# Patient Record
Sex: Female | Born: 1950
Health system: Southern US, Community
[De-identification: ages and names within clinical notes are randomized; demographics above are authoritative.]

## PROBLEM LIST (undated history)

## (undated) DIAGNOSIS — F329 Major depressive disorder, single episode, unspecified: Secondary | ICD-10-CM

## (undated) DIAGNOSIS — R945 Abnormal results of liver function studies: Secondary | ICD-10-CM

## (undated) DIAGNOSIS — M797 Fibromyalgia: Secondary | ICD-10-CM

## (undated) DIAGNOSIS — M1990 Unspecified osteoarthritis, unspecified site: Secondary | ICD-10-CM

## (undated) DIAGNOSIS — K5790 Diverticulosis of intestine, part unspecified, without perforation or abscess without bleeding: Secondary | ICD-10-CM

## (undated) DIAGNOSIS — R7989 Other specified abnormal findings of blood chemistry: Secondary | ICD-10-CM

## (undated) DIAGNOSIS — J189 Pneumonia, unspecified organism: Secondary | ICD-10-CM

## (undated) DIAGNOSIS — E7800 Pure hypercholesterolemia, unspecified: Secondary | ICD-10-CM

## (undated) DIAGNOSIS — F32A Depression, unspecified: Secondary | ICD-10-CM

## (undated) DIAGNOSIS — F419 Anxiety disorder, unspecified: Secondary | ICD-10-CM

## (undated) DIAGNOSIS — G9332 Myalgic encephalomyelitis/chronic fatigue syndrome: Secondary | ICD-10-CM

## (undated) DIAGNOSIS — K5792 Diverticulitis of intestine, part unspecified, without perforation or abscess without bleeding: Secondary | ICD-10-CM

## (undated) DIAGNOSIS — R5382 Chronic fatigue, unspecified: Secondary | ICD-10-CM

## (undated) HISTORY — DX: Other specified abnormal findings of blood chemistry: R79.89

## (undated) HISTORY — DX: Fibromyalgia: M79.7

## (undated) HISTORY — DX: Diverticulosis of intestine, part unspecified, without perforation or abscess without bleeding: K57.90

## (undated) HISTORY — PX: HERNIA REPAIR: SHX51

## (undated) HISTORY — PX: VAGINAL HYSTERECTOMY: SUR661

## (undated) HISTORY — DX: Chronic fatigue, unspecified: R53.82

## (undated) HISTORY — DX: Abnormal results of liver function studies: R94.5

## (undated) HISTORY — DX: Pure hypercholesterolemia, unspecified: E78.00

## (undated) HISTORY — DX: Myalgic encephalomyelitis/chronic fatigue syndrome: G93.32

## (undated) HISTORY — DX: Diverticulitis of intestine, part unspecified, without perforation or abscess without bleeding: K57.92

---
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
## 2001-12-23 ENCOUNTER — Other Ambulatory Visit: Admission: RE | Admit: 2001-12-23 | Discharge: 2001-12-23 | Payer: Self-pay | Admitting: Family Medicine

## 2003-07-14 ENCOUNTER — Encounter: Admission: RE | Admit: 2003-07-14 | Discharge: 2003-07-14 | Payer: Self-pay | Admitting: Family Medicine

## 2005-04-16 ENCOUNTER — Other Ambulatory Visit: Admission: RE | Admit: 2005-04-16 | Discharge: 2005-04-16 | Payer: Self-pay | Admitting: Family Medicine

## 2006-05-08 ENCOUNTER — Encounter: Admission: RE | Admit: 2006-05-08 | Discharge: 2006-05-08 | Payer: Self-pay | Admitting: Family Medicine

## 2009-05-05 ENCOUNTER — Encounter: Admission: RE | Admit: 2009-05-05 | Discharge: 2009-05-05 | Payer: Self-pay | Admitting: Family Medicine

## 2010-01-02 ENCOUNTER — Ambulatory Visit (HOSPITAL_COMMUNITY): Admission: RE | Admit: 2010-01-02 | Discharge: 2010-01-02 | Payer: Self-pay | Admitting: Family Medicine

## 2010-01-08 ENCOUNTER — Encounter: Admission: RE | Admit: 2010-01-08 | Discharge: 2010-01-08 | Payer: Self-pay | Admitting: Family Medicine

## 2010-08-07 ENCOUNTER — Encounter: Payer: Self-pay | Admitting: Gastroenterology

## 2010-08-07 ENCOUNTER — Ambulatory Visit (INDEPENDENT_AMBULATORY_CARE_PROVIDER_SITE_OTHER): Payer: BC Managed Care – PPO | Admitting: Gastroenterology

## 2010-08-07 ENCOUNTER — Other Ambulatory Visit (INDEPENDENT_AMBULATORY_CARE_PROVIDER_SITE_OTHER): Payer: BC Managed Care – PPO

## 2010-08-07 VITALS — BP 136/60 | Ht 61.0 in | Wt 160.0 lb

## 2010-08-07 DIAGNOSIS — R1013 Epigastric pain: Secondary | ICD-10-CM

## 2010-08-07 LAB — CBC WITH DIFFERENTIAL/PLATELET
Basophils Absolute: 0.1 10*3/uL (ref 0.0–0.1)
Basophils Relative: 0.8 % (ref 0.0–3.0)
Eosinophils Absolute: 0.2 10*3/uL (ref 0.0–0.7)
Eosinophils Relative: 3 % (ref 0.0–5.0)
HCT: 40.4 % (ref 36.0–46.0)
Hemoglobin: 14.2 g/dL (ref 12.0–15.0)
Lymphocytes Relative: 35.4 % (ref 12.0–46.0)
Lymphs Abs: 2.9 10*3/uL (ref 0.7–4.0)
MCHC: 35.2 g/dL (ref 30.0–36.0)
MCV: 95.7 fl (ref 78.0–100.0)
Monocytes Absolute: 0.7 10*3/uL (ref 0.1–1.0)
Monocytes Relative: 8.6 % (ref 3.0–12.0)
Neutro Abs: 4.3 10*3/uL (ref 1.4–7.7)
Neutrophils Relative %: 52.2 % (ref 43.0–77.0)
Platelets: 243 10*3/uL (ref 150.0–400.0)
RBC: 4.22 Mil/uL (ref 3.87–5.11)
RDW: 13.2 % (ref 11.5–14.6)
WBC: 8.2 10*3/uL (ref 4.5–10.5)

## 2010-08-07 LAB — COMPREHENSIVE METABOLIC PANEL WITH GFR
ALT: 23 U/L (ref 0–35)
AST: 23 U/L (ref 0–37)
Albumin: 3.5 g/dL (ref 3.5–5.2)
Alkaline Phosphatase: 71 U/L (ref 39–117)
BUN: 15 mg/dL (ref 6–23)
CO2: 29 meq/L (ref 19–32)
Calcium: 8.8 mg/dL (ref 8.4–10.5)
Chloride: 106 meq/L (ref 96–112)
Creatinine, Ser: 1 mg/dL (ref 0.4–1.2)
GFR: 63.7 mL/min
Glucose, Bld: 78 mg/dL (ref 70–99)
Potassium: 4.4 meq/L (ref 3.5–5.1)
Sodium: 141 meq/L (ref 135–145)
Total Bilirubin: 0.8 mg/dL (ref 0.3–1.2)
Total Protein: 6.3 g/dL (ref 6.0–8.3)

## 2010-08-07 NOTE — Patient Instructions (Addendum)
You will be set up for an ultrasound today or early tomorrow to check for gallstones.  [facility] 715 am 08/08/10 Nothing to eat or drink after midnight. You will have labs checked today in the basement lab.  Please head down after you check out with the front desk  (cbc, cmet).

## 2010-08-07 NOTE — Progress Notes (Signed)
   HPI: This is a  very pleasant 60 year old woman  Had terrible epigastric to RUQ pains, acute.  Cuased nausea.  Had mild fever.  Colic pains.  Pains lasted 6-7 hours.   never had problems before either.  Pain radiated to right shoulder.   Had repeat pains lasting about 3 hours last night.   She has been eating healthier, has lost 10-12 pounds lately.  Doesn't eat fried foods. No overt GI bleeding.  She takes almost no NSAIDs. She does not drink alcohol.  Review of systems: Pertinent positive and negative review of systems were noted in the above HPI section.  All other review of systems was otherwise negative.   Past Medical History, Past Surgical History, Family History, Social History, Current Medications, Allergies were all reviewed with the patient via [facility] electronic medical record system.   Physical Exam: BP 136/60  Ht 5\' 1"  (1.549 m)  Wt 160 lb (72.576 kg)  BMI 30.23 kg/m2 Constitutional: generally well-appearing Psychiatric: alert and oriented x3 Eyes: extraocular movements intact Mouth: oral pharynx moist, no lesions Neck: supple no lymphadenopathy Cardiovascular: heart regular rate and rhythm Lungs: clear to auscultation bilaterally Abdomen: soft, nontender, nondistended, no obvious ascites, no peritoneal signs, normal bowel sounds Extremities: no lower extremity edema bilaterally Skin: no lesions on visible extremities    Assessment and plan: 60 y.o. female with very typical biliary colic symptoms  We will set her up with an abdominal ultrasound today or tomorrow morning as well as a CBC and complete metabolic profile. I do think she is having biliary colic. If the ultrasound is normal my next step would be a HIDA scan to check her for biliary dyskinesia. She has had 2 pretty severe attacks in the past 4 or 5 days and I think it ultrasound is positive for gallstones then I would work on an expedited general surgical evaluation.

## 2010-08-08 ENCOUNTER — Ambulatory Visit (HOSPITAL_COMMUNITY)
Admission: RE | Admit: 2010-08-08 | Discharge: 2010-08-08 | Disposition: A | Discharge status: Home / Self Care | Payer: BC Managed Care – PPO | Source: Ambulatory Visit | Attending: Gastroenterology | Admitting: Gastroenterology

## 2010-08-08 ENCOUNTER — Telehealth: Payer: Self-pay | Admitting: Gastroenterology

## 2010-08-08 DIAGNOSIS — R1110 Vomiting, unspecified: Secondary | ICD-10-CM | POA: Insufficient documentation

## 2010-08-08 DIAGNOSIS — R1013 Epigastric pain: Secondary | ICD-10-CM | POA: Insufficient documentation

## 2010-08-08 DIAGNOSIS — R509 Fever, unspecified: Secondary | ICD-10-CM | POA: Insufficient documentation

## 2010-08-08 DIAGNOSIS — K838 Other specified diseases of biliary tract: Secondary | ICD-10-CM | POA: Insufficient documentation

## 2010-08-08 NOTE — Telephone Encounter (Signed)
Pt needs HIDA [facility]   08/29/10 10 am 945 am arrival nothing to eat or drink after midnight. Pt also aware of the labs.

## 2010-08-29 ENCOUNTER — Inpatient Hospital Stay (HOSPITAL_COMMUNITY): Admission: RE | Admit: 2010-08-29 | Payer: BC Managed Care – PPO | Source: Ambulatory Visit

## 2011-01-10 ENCOUNTER — Other Ambulatory Visit: Payer: Self-pay | Admitting: Nurse Practitioner

## 2011-01-10 DIAGNOSIS — R748 Abnormal levels of other serum enzymes: Secondary | ICD-10-CM

## 2011-01-14 ENCOUNTER — Ambulatory Visit
Admission: RE | Admit: 2011-01-14 | Discharge: 2011-01-14 | Disposition: A | Discharge status: Home / Self Care | Payer: BC Managed Care – PPO | Source: Ambulatory Visit | Attending: Nurse Practitioner | Admitting: Nurse Practitioner

## 2011-01-14 DIAGNOSIS — R748 Abnormal levels of other serum enzymes: Secondary | ICD-10-CM

## 2011-05-10 ENCOUNTER — Ambulatory Visit (INDEPENDENT_AMBULATORY_CARE_PROVIDER_SITE_OTHER): Payer: BC Managed Care – PPO | Admitting: Family Medicine

## 2011-05-10 ENCOUNTER — Ambulatory Visit: Payer: BC Managed Care – PPO

## 2011-05-10 VITALS — BP 152/100 | HR 85 | Temp 98.2°F | Resp 16 | Ht 62.0 in | Wt 160.0 lb

## 2011-05-10 DIAGNOSIS — R071 Chest pain on breathing: Secondary | ICD-10-CM

## 2011-05-10 DIAGNOSIS — R0781 Pleurodynia: Secondary | ICD-10-CM

## 2011-05-10 LAB — POCT CBC
Granulocyte percent: 50.8 % (ref 37–80)
HCT, POC: 46.8 % (ref 37.7–47.9)
Hemoglobin: 15 g/dL (ref 12.2–16.2)
Lymph, poc: 3.8 — AB (ref 0.6–3.4)
MCH, POC: 30.7 pg (ref 27–31.2)
MCHC: 32.1 g/dL (ref 31.8–35.4)
MCV: 95.8 fL (ref 80–97)
MID (cbc): 0.5 (ref 0–0.9)
MPV: 8.9 fL (ref 0–99.8)
POC Granulocyte: 4.4 (ref 2–6.9)
POC LYMPH PERCENT: 43.9 % (ref 10–50)
POC MID %: 5.3 % (ref 0–12)
Platelet Count, POC: 273 10*3/uL (ref 142–424)
RBC: 4.88 M/uL (ref 4.04–5.48)
RDW, POC: 13.2 %
WBC: 8.6 10*3/uL (ref 4.6–10.2)

## 2011-05-10 LAB — POCT SEDIMENTATION RATE: POCT SED RATE: 25 mm/h — AB (ref 0–22)

## 2011-05-10 MED ORDER — TRAMADOL HCL 50 MG PO TABS: 50.0000 mg | ORAL_TABLET | Freq: Three times a day (TID) | ORAL | Status: DC | PRN

## 2011-05-10 MED ORDER — OXAPROZIN 600 MG PO TABS: ORAL_TABLET | ORAL | Status: DC

## 2011-05-10 NOTE — Progress Notes (Signed)
Filed Vitals:   05/10/11 1350  BP: 152/100  Pulse: 85  Temp: 98.2 F (36.8 C)  Resp: 16   Subjective:: Patient is here complaining of a pain in her right upper back since Tuesday. It was just narrowed Tuesday which come up. She does not know of any cause of it. Did not do any activities out of the ordinary on Monday as a gripping pain, and just hurts which takes a deep breath. She has been trying various things without relief. She got a massage and that did not help. The person doing the massage did not find a real tender trigger point. No other respiratory symptoms at this time.  HEENT unremarkable. GI unremarkable. GU unremarkable. Cardiovascular unremarkable, although some of her friends said to be cautious, that women can get atypical pains from their heart attacks.  Objective: Alert oriented white female in no acute distress at this time. Her blood pressure was a little high when she came in, but on repeat I got 144/88. She had taken a couple times at home today and got good readings. Neck is supple. Chest clear to auscultation. Chest wall does not have any point tenderness on it. Good range of motion of her upper back. Heart was regular without any murmurs gallops arrhythmias. Abdomen soft, nontender. Straight leg raise test negative. Simply has a pleural like pain when she takes a deep breath. [facility] reading (PRIMARY) by  Dr. Alwyn Ren Chest x-ray is normal.  Results for orders placed in visit on 05/10/11  POCT CBC      Component Value Range   WBC 8.6  4.6 - 10.2 (K/uL)   Lymph, poc 3.8 (*) 0.6 - 3.4    POC LYMPH PERCENT 43.9  10 - 50 (%L)   MID (cbc) 0.5  0 - 0.9    POC MID % 5.3  0 - 12 (%M)   POC Granulocyte 4.4  2 - 6.9    Granulocyte percent 50.8  37 - 80 (%G)   RBC 4.88  4.04 - 5.48 (M/uL)   Hemoglobin 15.0  12.2 - 16.2 (g/dL)   HCT, POC 16.1  09.6 - 47.9 (%)   MCV 95.8  80 - 97 (fL)   MCH, POC 30.7  27 - 31.2 (pg)   MCHC 32.1  31.8 - 35.4 (g/dL)   RDW, POC 04.5     Platelet  Count, POC 273  142 - 424 (K/uL)   MPV 8.9  0 - 99.8 (fL)      Assessment: Pleuritic right upper back pain Nonhypertensive blood pressure elevation  Plan: Check a chest x-ray on her, as well as a CBC and sedimentation rate and will proceed from there thank

## 2011-05-10 NOTE — Patient Instructions (Signed)
Pleurisy Pleurisy is an inflammation and swelling of the lining of the lungs. It usually is the result of an underlying infection or other disease. Because of this inflammation, it hurts to breathe. It is aggravated by coughing or deep breathing. The primary goal in treating pleurisy is to diagnose and treat the condition that caused it.  HOME CARE INSTRUCTIONS   Only take over-the-counter or prescription medicines for pain, discomfort, or fever as directed by your caregiver.   If medications which kill germs (antibiotics) were prescribed, take the entire course. Even if you are feeling better, you need to take them.   Use a cool mist vaporizer to help loosen secretions. This is so the secretions can be coughed up more easily.  SEEK MEDICAL CARE IF:   Your pain is not controlled with medication or is increasing.   You have an increase inpus like (purulent) secretions brought up with coughing.  SEEK IMMEDIATE MEDICAL CARE IF:   You have blue or dark lips, fingernails, or toenails.   You begin coughing up blood.   You have increased difficulty breathing.   You have continuing pain unrelieved by medicine or lasting more than 1 week.   You have pain that radiates into your neck, arms, or jaw.   You develop increased shortness of breath or wheezing.   You develop a fever, rash, vomiting, fainting, or other serious complaints.  Document Released: 02/25/2005 Document Revised: 11/07/2010 Document Reviewed: 09/26/2006 Exit[employer] Patient Information 2012 [employer]. 

## 2011-05-13 ENCOUNTER — Encounter (HOSPITAL_COMMUNITY): Payer: Self-pay

## 2011-05-13 ENCOUNTER — Emergency Department (HOSPITAL_COMMUNITY): Payer: BC Managed Care – PPO

## 2011-05-13 ENCOUNTER — Emergency Department (HOSPITAL_COMMUNITY)
Admission: EM | Admit: 2011-05-13 | Discharge: 2011-05-13 | Disposition: A | Discharge status: Home / Self Care | Payer: BC Managed Care – PPO | Attending: Emergency Medicine | Admitting: Emergency Medicine

## 2011-05-13 ENCOUNTER — Other Ambulatory Visit: Payer: Self-pay

## 2011-05-13 DIAGNOSIS — M542 Cervicalgia: Secondary | ICD-10-CM | POA: Insufficient documentation

## 2011-05-13 DIAGNOSIS — M79609 Pain in unspecified limb: Secondary | ICD-10-CM

## 2011-05-13 DIAGNOSIS — R079 Chest pain, unspecified: Secondary | ICD-10-CM | POA: Insufficient documentation

## 2011-05-13 DIAGNOSIS — F411 Generalized anxiety disorder: Secondary | ICD-10-CM | POA: Insufficient documentation

## 2011-05-13 LAB — COMPREHENSIVE METABOLIC PANEL WITH GFR
ALT: 22 U/L (ref 0–35)
AST: 22 U/L (ref 0–37)
Albumin: 3.8 g/dL (ref 3.5–5.2)
Alkaline Phosphatase: 92 U/L (ref 39–117)
BUN: 12 mg/dL (ref 6–23)
CO2: 31 meq/L (ref 19–32)
Calcium: 10 mg/dL (ref 8.4–10.5)
Chloride: 103 meq/L (ref 96–112)
Creatinine, Ser: 0.79 mg/dL (ref 0.50–1.10)
GFR calc Af Amer: >90 mL/min
GFR calc non Af Amer: 88 mL/min — ABNORMAL LOW
Glucose, Bld: 120 mg/dL — ABNORMAL HIGH (ref 70–99)
Potassium: 4.8 meq/L (ref 3.5–5.1)
Sodium: 143 meq/L (ref 135–145)
Total Bilirubin: 0.6 mg/dL (ref 0.3–1.2)
Total Protein: 7.2 g/dL (ref 6.0–8.3)

## 2011-05-13 LAB — CBC
HCT: 42.5 % (ref 36.0–46.0)
Hemoglobin: 15.1 g/dL — ABNORMAL HIGH (ref 12.0–15.0)
MCH: 32.9 pg (ref 26.0–34.0)
MCHC: 35.5 g/dL (ref 30.0–36.0)
MCV: 92.6 fL (ref 78.0–100.0)
Platelets: 234 10*3/uL (ref 150–400)
RBC: 4.59 MIL/uL (ref 3.87–5.11)
RDW: 12.7 % (ref 11.5–15.5)
WBC: 9.6 10*3/uL (ref 4.0–10.5)

## 2011-05-13 LAB — D-DIMER, QUANTITATIVE: D-Dimer, Quant: 1.11 ug{FEU}/mL — ABNORMAL HIGH (ref 0.00–0.48)

## 2011-05-13 LAB — TROPONIN I
Troponin I: <0.3 ng/mL
Troponin I: <0.3 ng/mL

## 2011-05-13 MED ORDER — GI COCKTAIL ~~LOC~~
30.0000 mL | Freq: Once | ORAL | Status: AC
  Administered 2011-05-13: 30 mL via ORAL
  Filled 2011-05-13: qty 30

## 2011-05-13 MED ORDER — OMEPRAZOLE 20 MG PO CPDR: 20.0000 mg | DELAYED_RELEASE_CAPSULE | Freq: Every day | ORAL | Status: DC

## 2011-05-13 MED ORDER — NITROGLYCERIN 0.4 MG SL SUBL: 0.4000 mg | SUBLINGUAL_TABLET | SUBLINGUAL | Status: DC | PRN

## 2011-05-13 MED ORDER — IOHEXOL 300 MG/ML  SOLN
100.0000 mL | Freq: Once | INTRAMUSCULAR | Status: AC | PRN
  Administered 2011-05-13: 60 mL via INTRAVENOUS

## 2011-05-13 NOTE — ED Notes (Signed)
Patient reports that she has had persistent chest tightness with pain to left arm and neck, evaluated at [facility] a few days ago and negative exam. Patient reports that her pain has persisted

## 2011-05-13 NOTE — Progress Notes (Signed)
VASCULAR LAB PRELIMINARY  PRELIMINARY  PRELIMINARY  PRELIMINARY  Left lower extremity venous duplex completed.    Preliminary report:  Left:  No evidence of DVT, superficial thrombosis, or Baker's cyst.  Terance Hart, RVT 05/13/2011, 7:49 PM

## 2011-05-13 NOTE — Discharge Instructions (Signed)
Chest Pain (Nonspecific) It is often hard to give a specific diagnosis for the cause of chest pain. There is always a chance that your pain could be related to something serious, such as a heart attack or a blood clot in the lungs. You need to follow up with your caregiver for further evaluation. CAUSES   Heartburn.   Pneumonia or bronchitis.   Anxiety or stress.   Inflammation around your heart (pericarditis) or lung (pleuritis or pleurisy).   A blood clot in the lung.   A collapsed lung (pneumothorax). It can develop suddenly on its own (spontaneous pneumothorax) or from injury (trauma) to the chest.   Shingles infection (herpes zoster virus).  The chest wall is composed of bones, muscles, and cartilage. Any of these can be the source of the pain.  The bones can be bruised by injury.   The muscles or cartilage can be strained by coughing or overwork.   The cartilage can be affected by inflammation and become sore (costochondritis).  DIAGNOSIS  Lab tests or other studies, such as X-rays, electrocardiography, stress testing, or cardiac imaging, may be needed to find the cause of your pain.  TREATMENT   Treatment depends on what may be causing your chest pain. Treatment may include:   Acid blockers for heartburn.   Anti-inflammatory medicine.   Pain medicine for inflammatory conditions.   Antibiotics if an infection is present.   You may be advised to change lifestyle habits. This includes stopping smoking and avoiding alcohol, caffeine, and chocolate.   You may be advised to keep your head raised (elevated) when sleeping. This reduces the chance of acid going backward from your stomach into your esophagus.   Most of the time, nonspecific chest pain will improve within 2 to 3 days with rest and mild pain medicine.  HOME CARE INSTRUCTIONS   If antibiotics were prescribed, take your antibiotics as directed. Finish them even if you start to feel better.   For the next few  days, avoid physical activities that bring on chest pain. Continue physical activities as directed.   Do not smoke.   Avoid drinking alcohol.   Only take over-the-counter or prescription medicine for pain, discomfort, or fever as directed by your caregiver.   Follow your caregiver's suggestions for further testing if your chest pain does not go away.   Keep any follow-up appointments you made. If you do not go to an appointment, you could develop lasting (chronic) problems with pain. If there is any problem keeping an appointment, you must call to reschedule.  SEEK MEDICAL CARE IF:   You think you are having problems from the medicine you are taking. Read your medicine instructions carefully.   Your chest pain does not go away, even after treatment.   You develop a rash with blisters on your chest.  SEEK IMMEDIATE MEDICAL CARE IF:   You have increased chest pain or pain that spreads to your arm, neck, jaw, back, or abdomen.   You develop shortness of breath, an increasing cough, or you are coughing up blood.   You have severe back or abdominal pain, feel nauseous, or vomit.   You develop severe weakness, fainting, or chills.   You have a fever.  THIS IS AN EMERGENCY. Do not wait to see if the pain will go away. Get medical help at once. Call your local emergency services (911 in [place] Do not drive yourself to the hospital. MAKE SURE YOU:   Understand these instructions.     Will watch your condition.   Will get help right away if you are not doing well or get worse.  Document Released: 12/05/2004 Document Revised: 02/14/2011 Document Reviewed: 10/01/2007 Exit[employer] Patient Information 2012 [employer]. 

## 2011-05-14 NOTE — ED Provider Notes (Signed)
History     CSN: [id number]  Arrival date & time 05/13/11  1618   First MD Initiated Contact with Patient 05/13/11 1649      Chief Complaint  Patient presents with  . Chest Pain    Patient is a 61 y.o. female presenting with chest pain. The history is provided by the patient.  Chest Pain    patient reports approximately 3 days of chest tightness as well as left neck and arm tightness.  Diagnosis been persistent however today her tightness is worsened and has been present for the majority of the day.  She has a history of fibromyalgia and hypercholesterolemia.  She does have a distant history of cardiac disease in the family.  She does not smoke cigarettes.  She's never had a stress test.  She does report recent travel tick history, long flight back.  Her husband currently is hospitalized at [facility] where he underwent emergent bypass surgery for three-vessel coronary disease.  This is occurred over the last 4 or 5 days.  The patient denies nausea vomiting or diarrhea.  She reports one episode of sweating.  She does report subjective dyspnea.  She reports some discomfort down her left thigh and hip which is new for associated with her current symptoms.  She has no prior history of DVT or pulmonary embolism.  She's had no recent surgery.  She is however have recent long travel.  She is very concerned this could be coming from her heart especially given her husband's presentation.  Past Medical History  Diagnosis Date  . Fibromyalgia   . Hypercholesterolemia     Past Surgical History  Procedure Date  . Abdominal hysterectomy     Family History  Problem Relation Age of Onset  . Lung cancer Mother     History  Substance Use Topics  . Smoking status: Never Smoker   . Smokeless tobacco: Not on file  . Alcohol Use: Yes     occ    OB History    Grav Para Term Preterm Abortions TAB SAB Ect Mult Living                  Review of Systems  Cardiovascular: Positive for chest  pain.  All other systems reviewed and are negative.    Allergies  Valtrex  Home Medications   Current Outpatient Rx  Name Route Sig Dispense Refill  . VITAMIN D 2000 UNITS PO TABS Oral Take 2,000 Units by mouth daily.    Marland Kitchen KRILL OIL 300 MG PO CAPS Oral Take 1 capsule by mouth daily.     Marland Kitchen MAGNESIUM OXIDE 400 MG PO TABS Oral Take 400 mg by mouth at bedtime.     . OXAPROZIN 600 MG PO TABS  Take 1 twice daily for pain and inflammation of pleurisy 20 tablet 0  . TRAMADOL HCL 50 MG PO TABS Oral Take 50 mg by mouth every 8 (eight) hours as needed.    Marland Kitchen OMEPRAZOLE 20 MG PO CPDR Oral Take 1 capsule (20 mg total) by mouth daily. 30 capsule 0    BP 137/70  Pulse 71  Temp(Src) 98.5 F (36.9 C) (Oral)  Resp 18  Wt 160 lb (72.576 kg)  SpO2 96%  Physical Exam  Nursing note and vitals reviewed. Constitutional: She is oriented to person, place, and time. She appears well-developed and well-nourished. No distress.  HENT:  Head: Normocephalic and atraumatic.  Eyes: EOM are normal.  Neck: Normal range of motion.  Cardiovascular: Normal rate, regular rhythm and normal heart sounds.   Pulmonary/Chest: Effort normal and breath sounds normal.  Abdominal: Soft. She exhibits no distension. There is no tenderness.  Musculoskeletal: Normal range of motion.  Neurological: She is alert and oriented to person, place, and time.  Skin: Skin is warm and dry.  Psychiatric: She has a normal mood and affect. Judgment normal.       Anxious appearing    ED Course  Procedures (including critical care time)   Date: 05/14/2011  Rate: 114  Rhythm: Sinus tachycardia  QRS Axis: normal  Intervals: normal  ST/T Wave abnormalities: normal  Conduction Disutrbances: none  Narrative Interpretation:   Old EKG Reviewed: No prior EKGs available     Labs Reviewed  CBC - Abnormal; Notable for the following:    Hemoglobin 15.1 (*)    All other components within normal limits  COMPREHENSIVE METABOLIC PANEL -  Abnormal; Notable for the following:    Glucose, Bld 120 (*)    GFR calc non Af Amer 88 (*)    All other components within normal limits  D-DIMER, QUANTITATIVE - Abnormal; Notable for the following:    D-Dimer, Quant 1.11 (*)    All other components within normal limits  TROPONIN I  TROPONIN I   Dg Chest 2 View  05/13/2011  *RADIOLOGY REPORT*  Clinical Data: Chest pain and tightness.  CHEST - 2 VIEW  Comparison: 05/10/2011  Findings: Bibasilar scarring noted, stable from 2011.  Cardiac and mediastinal contours appear unremarkable.  The lungs appear otherwise clear.  IMPRESSION:  1.  Stable bibasilar scarring.  No acute findings.  Original Report Authenticated By: Dellia Cloud, M.D.   Ct Angio Chest W/cm &/or Wo Cm  05/13/2011  *RADIOLOGY REPORT*  Clinical Data: Chest pain.  Left arm pain and left-sided neck pain.  CT ANGIOGRAPHY CHEST  Technique:  Multidetector CT imaging of the chest using the standard protocol during bolus administration of intravenous contrast. Multiplanar reconstructed images including MIPs were obtained and reviewed to evaluate the vascular anatomy.  Contrast: 60mL OMNIPAQUE IOHEXOL 300 MG/ML IJ SOLN  Comparison: Chest x-ray dated 05/13/2011 and CTA of the chest dated 01/08/2010  Findings: There are no pulmonary emboli, infiltrates, or effusions. Heart size is normal.  No hilar or mediastinal adenopathy.  No osseous abnormality.  Minimal linear atelectasis at both lung bases.  IMPRESSION: Minimal linear atelectasis at the bases.  Otherwise normal exam. No pulmonary emboli.  Original Report Authenticated By: Gwynn Burly, M.D.   I personally reviewed the x-ray and CT scan  1. Chest pain       MDM  The patient has had constant chest tightness for several days including constant tightness today.  Her EKG and enzymes x2 are normal.  Duplex ultrasound of her left lower extremity is negative for DVT and she is a CT injury chest which is negative for pulmonary  embolus.  I suspect the majority of her chest tightness is secondary to stress and possibly gastroesophageal reflux disease.  The patient has had a very long week with her husband having emergent bypass surgery.  She does have restricted for cardiac disease and I do think it is important for her to follow up with the cardiologist for additional risk stratification which may include stress testing.  The patient understands to return to the ER for new or worsening symptoms        Lyanne Co, MD 05/14/11 [phone]

## 2011-09-18 ENCOUNTER — Other Ambulatory Visit: Payer: Self-pay | Admitting: Family Medicine

## 2011-09-18 DIAGNOSIS — Z780 Asymptomatic menopausal state: Secondary | ICD-10-CM

## 2011-09-18 DIAGNOSIS — Z1231 Encounter for screening mammogram for malignant neoplasm of breast: Secondary | ICD-10-CM

## 2011-11-05 ENCOUNTER — Ambulatory Visit: Payer: BC Managed Care – PPO

## 2011-11-05 ENCOUNTER — Other Ambulatory Visit: Payer: BC Managed Care – PPO

## 2011-11-20 ENCOUNTER — Ambulatory Visit: Payer: BC Managed Care – PPO

## 2011-11-20 ENCOUNTER — Other Ambulatory Visit: Payer: BC Managed Care – PPO

## 2011-12-09 ENCOUNTER — Ambulatory Visit
Admission: RE | Admit: 2011-12-09 | Discharge: 2011-12-09 | Disposition: A | Discharge status: Home / Self Care | Payer: BC Managed Care – PPO | Source: Ambulatory Visit | Attending: Family Medicine | Admitting: Family Medicine

## 2011-12-09 DIAGNOSIS — Z1231 Encounter for screening mammogram for malignant neoplasm of breast: Secondary | ICD-10-CM

## 2011-12-09 DIAGNOSIS — Z780 Asymptomatic menopausal state: Secondary | ICD-10-CM

## 2013-05-10 ENCOUNTER — Encounter: Payer: Self-pay | Admitting: General Surgery

## 2013-05-10 DIAGNOSIS — E785 Hyperlipidemia, unspecified: Secondary | ICD-10-CM | POA: Insufficient documentation

## 2013-11-09 ENCOUNTER — Emergency Department (HOSPITAL_COMMUNITY)
Admission: EM | Admit: 2013-11-09 | Discharge: 2013-11-09 | Disposition: A | Discharge status: Home / Self Care | Payer: BC Managed Care – PPO | Attending: Emergency Medicine | Admitting: Emergency Medicine

## 2013-11-09 ENCOUNTER — Encounter (HOSPITAL_COMMUNITY): Payer: Self-pay | Admitting: Emergency Medicine

## 2013-11-09 DIAGNOSIS — Z79899 Other long term (current) drug therapy: Secondary | ICD-10-CM | POA: Diagnosis not present

## 2013-11-09 DIAGNOSIS — Z8739 Personal history of other diseases of the musculoskeletal system and connective tissue: Secondary | ICD-10-CM | POA: Diagnosis not present

## 2013-11-09 DIAGNOSIS — Z8639 Personal history of other endocrine, nutritional and metabolic disease: Secondary | ICD-10-CM | POA: Insufficient documentation

## 2013-11-09 DIAGNOSIS — Z8659 Personal history of other mental and behavioral disorders: Secondary | ICD-10-CM | POA: Diagnosis not present

## 2013-11-09 DIAGNOSIS — K429 Umbilical hernia without obstruction or gangrene: Secondary | ICD-10-CM | POA: Insufficient documentation

## 2013-11-09 DIAGNOSIS — Z862 Personal history of diseases of the blood and blood-forming organs and certain disorders involving the immune mechanism: Secondary | ICD-10-CM | POA: Diagnosis not present

## 2013-11-09 LAB — URINALYSIS, ROUTINE W REFLEX MICROSCOPIC
Bilirubin Urine: NEGATIVE
Glucose, UA: NEGATIVE mg/dL
Hgb urine dipstick: NEGATIVE
Ketones, ur: NEGATIVE mg/dL
Leukocytes, UA: NEGATIVE
Nitrite: NEGATIVE
Protein, ur: NEGATIVE mg/dL
Specific Gravity, Urine: 1.015 (ref 1.005–1.030)
Urobilinogen, UA: 0.2 mg/dL (ref 0.0–1.0)
pH: 5.5 (ref 5.0–8.0)

## 2013-11-09 LAB — LACTIC ACID, PLASMA: Lactic Acid, Venous: 0.8 mmol/L (ref 0.5–2.2)

## 2013-11-09 NOTE — ED Provider Notes (Signed)
CSN: [id number]     Arrival date & time 11/09/13  1450 History   First MD Initiated Contact with Patient 11/09/13 1512     Chief Complaint  Patient presents with  . Hernia     HPI BRENN GATTON is a 63 y.o. female complaining of increasing pain to umbilical hernia. She had an umbilical hernia after her last pregnancy over 35 years ago. She recently has had bronchitis and has been coughing for about a week. This has increased the pain. Patient denies skin changes to the area. She reports she has been able to reduce this hernia. However the pain has increased. She was seen by her primary care provider and was sent here. She had called the surgical services however they were so busy that they sent her here. Patient denies nausea vomiting, diarrhea, other abdominal pain. Patient reports her last bowel movement was yesterday normal without hematochezia. No melena. Patient denies urinary complaints. H/o vaginal hysterectomy. She does have a 10 day onset of right lower back pain. Patient denies trauma. No urinary retention or incontinence. No numbness tingling or weakness. Gait normal.   Past Medical History  Diagnosis Date  . Fibromyalgia   . Hypercholesterolemia   . Anxiety and depression   . Chronic fatigue syndrome   . Elevated LFTs   . Generalized headaches    Past Surgical History  Procedure Laterality Date  . Abdominal hysterectomy     Family History  Problem Relation Age of Onset  . Lung cancer Mother    History  Substance Use Topics  . Smoking status: Never Smoker   . Smokeless tobacco: Not on file  . Alcohol Use: Yes     Comment: occ   OB History   Grav Para Term Preterm Abortions TAB SAB Ect Mult Living                 Review of Systems  Constitutional: Negative for fever and chills.  HENT: Negative for congestion and rhinorrhea.   Eyes: Negative for visual disturbance.  Respiratory: Negative for cough and shortness of breath.   Cardiovascular: Negative for chest  pain and palpitations.  Gastrointestinal: Negative for nausea, vomiting and diarrhea.  Genitourinary: Negative for dysuria and hematuria.  Musculoskeletal: Negative for back pain and gait problem.  Skin: Negative for rash.  Neurological: Negative for weakness and headaches.      Allergies  Tramadol and Valacyclovir hcl  Home Medications   Prior to Admission medications   Medication Sig Start Date End Date Taking? Authorizing Provider  Cholecalciferol (VITAMIN D PO) Take 1 tablet by mouth daily.   Yes Historical Provider, MD  Glutamine 500 MG CAPS Take 1 capsule by mouth daily.   Yes Historical Provider, MD   BP 148/79  Pulse 80  Temp(Src) 98.2 F (36.8 C) (Oral)  Resp 16  SpO2 99% Physical Exam  Nursing note and vitals reviewed. Constitutional: She appears well-developed and well-nourished. No distress.  HENT:  Head: Normocephalic and atraumatic.  Eyes: Conjunctivae and EOM are normal. Right eye exhibits no discharge. Left eye exhibits no discharge. No scleral icterus.  Neck: Normal range of motion.  Cardiovascular: Normal rate, regular rhythm and normal heart sounds.   Pulmonary/Chest: Effort normal and breath sounds normal. No respiratory distress. She has no wheezes.  Abdominal: Soft. Bowel sounds are normal. She exhibits no distension and no mass. There is no tenderness. There is no rebound and no guarding.  Soft 2cm umbilical hernia without induration. No overlying skin  changes. Hernia is easily reduced while patient supine and with standing. Patient with mild pain to palpation of hernia. No peritonitis.  Musculoskeletal: Normal range of motion. She exhibits no tenderness.  No midline back tenderness without crepitus or step off. ROM of back. Right sided lower back pain to palpation. No CVA tenderness.  Neurological: She is alert. She exhibits normal muscle tone. Coordination normal.  Skin: Skin is warm and dry. She is not diaphoretic.  Psychiatric: She has a normal mood  and affect. Her behavior is normal.    ED Course  Procedures (including critical care time) Labs Review Labs Reviewed  URINALYSIS, ROUTINE W REFLEX MICROSCOPIC  LACTIC ACID, PLASMA    Imaging Review No results found.   EKG Interpretation None     Meds given in ED:  Medications - No data to display  Discharge Medication List as of 11/09/2013  5:23 PM        MDM   Final diagnoses:  Umbilical hernia, recurrence not specified   YVETT ROSSEL is a 63 y.o. female with PMH of umbilical hernia who reports increased pain at the site in the context of increased coughing due to recent bronchitis episode. VSS. Hernia is easily reducible without induration, redness, other skin changes or severe pain. Remainder of her abdominal exam unremarkable. Patient without fevers. Patient also complains of 10 day history of low back pain and is without red flags. I doubt cauda equina. Patient is afebrile, nontoxic, and in no acute distress. Patient is appropriate for outpatient management and is stable for discharge. Follow up with [place] surgery for potential surgical repair of hernia.   Discussed return precautions with patient. Discussed all results and patient verbalizes understanding and agrees with plan.  This is a shared patient. This patient was discussed with the physician, Dr. Christ Kick who saw and evaluated the patient.      Pura Spice, PA-C 11/10/13 1213

## 2013-11-09 NOTE — ED Provider Notes (Signed)
  Face-to-face evaluation   History: She reports pain at site of umbilical hernia. She has been touching and pushing on it for the last several days.  Physical exam: Alert, calm, comfortable. Umbilicus- no mass or hernia. No significant abdominal tenderness  Medical screening examination/treatment/procedure(s) were conducted as a shared visit with non-physician practitioner(s) and myself.  I personally evaluated the patient during the encounter  Richarda Blade, MD 11/11/13 2004

## 2013-11-09 NOTE — ED Notes (Signed)
Per pt, has hx of umbilical hernia.  Pt recently had bronchitis and coughing. Since this, pt has had increase pain in hernia site.

## 2013-11-09 NOTE — Discharge Instructions (Signed)
Return to the emergency room with worsening of symptoms, new symptoms or with symptoms that are concerning, increasing/severe abdominal pain, nausea, vomiting, overlying skin changes of hernia. Follow up with [facility] [place] [facility]. Make appointment as soon as possible.    Hernia A hernia occurs when an internal organ pushes out through a weak spot in the abdominal wall. Hernias most commonly occur in the groin and around the navel. Hernias often can be pushed back into place (reduced). Most hernias tend to get worse over time. Some abdominal hernias can get stuck in the opening (irreducible or incarcerated hernia) and cannot be reduced. An irreducible abdominal hernia which is tightly squeezed into the opening is at risk for impaired blood supply (strangulated hernia). A strangulated hernia is a medical emergency. Because of the risk for an irreducible or strangulated hernia, surgery may be recommended to repair a hernia. CAUSES   Heavy lifting.  Prolonged coughing.  Straining to have a bowel movement.  A cut (incision) made during an abdominal surgery. HOME CARE INSTRUCTIONS   Bed rest is not required. You may continue your normal activities.  Avoid lifting more than 10 pounds (4.5 kg) or straining.  Cough gently. If you are a smoker it is best to stop. Even the best hernia repair can break down with the continual strain of coughing. Even if you do not have your hernia repaired, a cough will continue to aggravate the problem.  Do not wear anything tight over your hernia. Do not try to keep it in with an outside bandage or truss. These can damage abdominal contents if they are trapped within the hernia sac.  Eat a normal diet.  Avoid constipation. Straining over long periods of time will increase hernia size and encourage breakdown of repairs. If you cannot do this with diet alone, stool softeners may be used. SEEK IMMEDIATE MEDICAL CARE IF:   You have a fever.  You develop  increasing abdominal pain.  You feel nauseous or vomit.  Your hernia is stuck outside the abdomen, looks discolored, feels hard, or is tender.  You have any changes in your bowel habits or in the hernia that are unusual for you.  You have increased pain or swelling around the hernia.  You cannot push the hernia back in place by applying gentle pressure while lying down. MAKE SURE YOU:   Understand these instructions.  Will watch your condition.  Will get help right away if you are not doing well or get worse. Document Released: 02/25/2005 Document Revised: 05/20/2011 Document Reviewed: 10/15/2007 [facility] Patient Information 2015 [place], [place]. This information is not intended to replace advice given to you by your health care provider. Make sure you discuss any questions you have with your health care provider.

## 2014-12-28 ENCOUNTER — Encounter (HOSPITAL_COMMUNITY): Admission: AD | Disposition: A | Discharge status: Home / Self Care | Payer: Self-pay | Source: Ambulatory Visit

## 2014-12-28 ENCOUNTER — Ambulatory Visit: Payer: Self-pay | Admitting: General Surgery

## 2014-12-28 ENCOUNTER — Observation Stay (HOSPITAL_COMMUNITY): Payer: 59 | Admitting: Certified Registered"

## 2014-12-28 ENCOUNTER — Encounter (HOSPITAL_COMMUNITY): Payer: Self-pay | Admitting: Certified Registered Nurse Anesthetist

## 2014-12-28 ENCOUNTER — Other Ambulatory Visit: Payer: Self-pay | Admitting: General Surgery

## 2014-12-28 ENCOUNTER — Observation Stay (HOSPITAL_COMMUNITY)
Admission: AD | Admit: 2014-12-28 | Discharge: 2014-12-29 | Disposition: A | Discharge status: Home / Self Care | Payer: 59 | Source: Ambulatory Visit | Attending: Surgery | Admitting: Surgery

## 2014-12-28 DIAGNOSIS — K420 Umbilical hernia with obstruction, without gangrene: Secondary | ICD-10-CM | POA: Insufficient documentation

## 2014-12-28 HISTORY — PX: UMBILICAL HERNIA REPAIR: SHX196

## 2014-12-28 HISTORY — DX: Depression, unspecified: F32.A

## 2014-12-28 HISTORY — DX: Pneumonia, unspecified organism: J18.9

## 2014-12-28 HISTORY — DX: Anxiety disorder, unspecified: F41.9

## 2014-12-28 HISTORY — DX: Major depressive disorder, single episode, unspecified: F32.9

## 2014-12-28 HISTORY — DX: Unspecified osteoarthritis, unspecified site: M19.90

## 2014-12-28 LAB — CBC
HCT: 44.5 % (ref 36.0–46.0)
Hemoglobin: 14.9 g/dL (ref 12.0–15.0)
MCH: 31.6 pg (ref 26.0–34.0)
MCHC: 33.5 g/dL (ref 30.0–36.0)
MCV: 94.5 fL (ref 78.0–100.0)
Platelets: 212 10*3/uL (ref 150–400)
RBC: 4.71 MIL/uL (ref 3.87–5.11)
RDW: 12.8 % (ref 11.5–15.5)
WBC: 8.1 10*3/uL (ref 4.0–10.5)

## 2014-12-28 SURGERY — HERNIA REPAIR UMBILICAL ADULT
Anesthesia: General | Site: Abdomen

## 2014-12-28 MED ORDER — FENTANYL CITRATE (PF) 100 MCG/2ML IJ SOLN: 25.0000 ug | INTRAMUSCULAR | Status: DC | PRN

## 2014-12-28 MED ORDER — ROCURONIUM BROMIDE 100 MG/10ML IV SOLN
INTRAVENOUS | Status: DC | PRN
  Administered 2014-12-28: 30 mg via INTRAVENOUS

## 2014-12-28 MED ORDER — ONDANSETRON HCL 4 MG/2ML IJ SOLN
INTRAMUSCULAR | Status: AC
  Filled 2014-12-28: qty 2

## 2014-12-28 MED ORDER — ONDANSETRON 4 MG PO TBDP: 4.0000 mg | ORAL_TABLET | Freq: Four times a day (QID) | ORAL | Status: DC | PRN

## 2014-12-28 MED ORDER — MIDAZOLAM HCL 5 MG/5ML IJ SOLN
INTRAMUSCULAR | Status: DC | PRN
  Administered 2014-12-28: 2 mg via INTRAVENOUS

## 2014-12-28 MED ORDER — ROCURONIUM BROMIDE 50 MG/5ML IV SOLN
INTRAVENOUS | Status: AC
  Filled 2014-12-28: qty 1

## 2014-12-28 MED ORDER — ONDANSETRON HCL 4 MG/2ML IJ SOLN: 4.0000 mg | Freq: Four times a day (QID) | INTRAMUSCULAR | Status: DC | PRN

## 2014-12-28 MED ORDER — DIPHENHYDRAMINE HCL 12.5 MG/5ML PO ELIX: 12.5000 mg | ORAL_SOLUTION | Freq: Four times a day (QID) | ORAL | Status: DC | PRN

## 2014-12-28 MED ORDER — ONDANSETRON HCL 4 MG/2ML IJ SOLN
INTRAMUSCULAR | Status: DC | PRN
  Administered 2014-12-28: 4 mg via INTRAVENOUS

## 2014-12-28 MED ORDER — SUGAMMADEX SODIUM 200 MG/2ML IV SOLN
INTRAVENOUS | Status: DC | PRN
  Administered 2014-12-28 (×2): 200 mg via INTRAVENOUS

## 2014-12-28 MED ORDER — BUPIVACAINE-EPINEPHRINE 0.25% -1:200000 IJ SOLN
INTRAMUSCULAR | Status: DC | PRN
  Administered 2014-12-28: 20 mL

## 2014-12-28 MED ORDER — LACTATED RINGERS IV SOLN
INTRAVENOUS | Status: DC
  Administered 2014-12-28 (×2): via INTRAVENOUS

## 2014-12-28 MED ORDER — IBUPROFEN 600 MG PO TABS: 600.0000 mg | ORAL_TABLET | Freq: Four times a day (QID) | ORAL | Status: DC | PRN

## 2014-12-28 MED ORDER — 0.9 % SODIUM CHLORIDE (POUR BTL) OPTIME
TOPICAL | Status: DC | PRN
  Administered 2014-12-28: 1000 mL

## 2014-12-28 MED ORDER — MORPHINE SULFATE (PF) 2 MG/ML IV SOLN
1.0000 mg | INTRAVENOUS | Status: DC | PRN
  Administered 2014-12-28: 4 mg via INTRAVENOUS
  Administered 2014-12-28: 2 mg via INTRAVENOUS
  Filled 2014-12-28: qty 1
  Filled 2014-12-28: qty 2

## 2014-12-28 MED ORDER — CEFAZOLIN SODIUM-DEXTROSE 2-3 GM-% IV SOLR
2.0000 g | Freq: Three times a day (TID) | INTRAVENOUS | Status: DC
  Administered 2014-12-28 – 2014-12-29 (×3): 2 g via INTRAVENOUS
  Filled 2014-12-28 (×6): qty 50

## 2014-12-28 MED ORDER — HYDROCODONE-ACETAMINOPHEN 5-325 MG PO TABS
1.0000 | ORAL_TABLET | ORAL | Status: DC | PRN
  Administered 2014-12-29: 2 via ORAL
  Filled 2014-12-28: qty 2

## 2014-12-28 MED ORDER — ONDANSETRON HCL 4 MG/2ML IJ SOLN: 4.0000 mg | Freq: Once | INTRAMUSCULAR | Status: DC | PRN

## 2014-12-28 MED ORDER — SUGAMMADEX SODIUM 200 MG/2ML IV SOLN
INTRAVENOUS | Status: AC
  Filled 2014-12-28: qty 2

## 2014-12-28 MED ORDER — DIPHENHYDRAMINE HCL 50 MG/ML IJ SOLN
INTRAMUSCULAR | Status: DC | PRN
  Administered 2014-12-28: 12.5 mg via INTRAVENOUS

## 2014-12-28 MED ORDER — DEXAMETHASONE SODIUM PHOSPHATE 4 MG/ML IJ SOLN
INTRAMUSCULAR | Status: DC | PRN
  Administered 2014-12-28: 8 mg via INTRAVENOUS

## 2014-12-28 MED ORDER — PROPOFOL 10 MG/ML IV BOLUS
INTRAVENOUS | Status: AC
  Filled 2014-12-28: qty 20

## 2014-12-28 MED ORDER — MIDAZOLAM HCL 2 MG/2ML IJ SOLN
INTRAMUSCULAR | Status: AC
  Filled 2014-12-28: qty 4

## 2014-12-28 MED ORDER — ALBUTEROL SULFATE HFA 108 (90 BASE) MCG/ACT IN AERS
INHALATION_SPRAY | RESPIRATORY_TRACT | Status: DC | PRN
  Administered 2014-12-28: 4 via RESPIRATORY_TRACT

## 2014-12-28 MED ORDER — ARTIFICIAL TEARS OP OINT
TOPICAL_OINTMENT | OPHTHALMIC | Status: AC
  Filled 2014-12-28: qty 3.5

## 2014-12-28 MED ORDER — PROPOFOL 10 MG/ML IV BOLUS
INTRAVENOUS | Status: DC | PRN
  Administered 2014-12-28: 180 mg via INTRAVENOUS

## 2014-12-28 MED ORDER — FENTANYL CITRATE (PF) 100 MCG/2ML IJ SOLN
INTRAMUSCULAR | Status: DC | PRN
  Administered 2014-12-28: 100 ug via INTRAVENOUS
  Administered 2014-12-28 (×2): 50 ug via INTRAVENOUS

## 2014-12-28 MED ORDER — FENTANYL CITRATE (PF) 250 MCG/5ML IJ SOLN
INTRAMUSCULAR | Status: AC
  Filled 2014-12-28: qty 5

## 2014-12-28 MED ORDER — SODIUM CHLORIDE 0.9 % IV SOLN: 4.0000 mg | Freq: Four times a day (QID) | INTRAVENOUS | Status: AC | PRN

## 2014-12-28 MED ORDER — DIPHENHYDRAMINE HCL 50 MG/ML IJ SOLN: 12.5000 mg | Freq: Four times a day (QID) | INTRAMUSCULAR | Status: DC | PRN

## 2014-12-28 MED ORDER — ENOXAPARIN SODIUM 40 MG/0.4ML ~~LOC~~ SOLN
40.0000 mg | SUBCUTANEOUS | Status: DC
  Administered 2014-12-29: 40 mg via SUBCUTANEOUS
  Filled 2014-12-28: qty 0.4

## 2014-12-28 MED ORDER — ONDANSETRON 4 MG PO TBDP: 4.0000 mg | ORAL_TABLET | Freq: Four times a day (QID) | ORAL | Status: AC | PRN

## 2014-12-28 MED ORDER — FENTANYL CITRATE (PF) 100 MCG/2ML IJ SOLN: 25.0000 ug | INTRAMUSCULAR | Status: AC | PRN

## 2014-12-28 MED ORDER — KCL IN DEXTROSE-NACL 20-5-0.45 MEQ/L-%-% IV SOLN: INTRAVENOUS | Status: AC

## 2014-12-28 MED ORDER — LIDOCAINE HCL (CARDIAC) 20 MG/ML IV SOLN
INTRAVENOUS | Status: DC | PRN
  Administered 2014-12-28: 60 mg via INTRATRACHEAL
  Administered 2014-12-28: 40 mg via INTRAVENOUS

## 2014-12-28 SURGICAL SUPPLY — 44 items
BLADE SURG 10 STRL SS (BLADE) ×2
BLADE SURG 15 STRL LF DISP TIS (BLADE) ×1
BLADE SURG 15 STRL SS (BLADE) ×2
BLADE SURG ROTATE 9660 (MISCELLANEOUS)
CANISTER SUCTION 2500CC (MISCELLANEOUS)
CHLORAPREP W/TINT 26ML (MISCELLANEOUS) ×2
COVER SURGICAL LIGHT HANDLE (MISCELLANEOUS) ×2
DECANTER SPIKE VIAL GLASS SM (MISCELLANEOUS) ×2
DRAPE PED LAPAROTOMY (DRAPES) ×2
DRAPE UTILITY XL STRL (DRAPES) ×4
DRSG TEGADERM 4X4.75 (GAUZE/BANDAGES/DRESSINGS) ×2
ELECT CAUTERY BLADE 6.4 (BLADE) ×2
ELECT REM PT RETURN 9FT ADLT (ELECTROSURGICAL) ×2
GAUZE SPONGE 2X2 8PLY STRL LF (GAUZE/BANDAGES/DRESSINGS) ×1
GLOVE BIOGEL PI IND STRL 7.0 (GLOVE) ×1
GLOVE BIOGEL PI INDICATOR 7.0 (GLOVE) ×1
GLOVE SURG SIGNA 7.5 PF LTX (GLOVE) ×2
GLOVE SURG SS PI 7.0 STRL IVOR (GLOVE) ×2
GOWN STRL REUS W/ TWL LRG LVL3 (GOWN DISPOSABLE) ×1
GOWN STRL REUS W/ TWL XL LVL3 (GOWN DISPOSABLE) ×1
GOWN STRL REUS W/TWL LRG LVL3 (GOWN DISPOSABLE) ×2
GOWN STRL REUS W/TWL XL LVL3 (GOWN DISPOSABLE) ×2
KIT BASIN OR (CUSTOM PROCEDURE TRAY) ×2
KIT ROOM TURNOVER OR (KITS) ×2
LIQUID BAND (GAUZE/BANDAGES/DRESSINGS) ×2
NDL HYPO 25GX1X1/2 BEV (NEEDLE) ×1 IMPLANT
NEEDLE HYPO 25GX1X1/2 BEV (NEEDLE) ×2
NS IRRIG 1000ML POUR BTL (IV SOLUTION) ×2
PACK SURGICAL SETUP 50X90 (CUSTOM PROCEDURE TRAY) ×2
PAD ARMBOARD 7.5X6 YLW CONV (MISCELLANEOUS) ×2
PENCIL BUTTON HOLSTER BLD 10FT (ELECTRODE) ×2
SPONGE GAUZE 2X2 STER 10/PKG (GAUZE/BANDAGES/DRESSINGS) ×1
SPONGE LAP 18X18 X RAY DECT (DISPOSABLE) ×2
STAPLER VISISTAT 35W (STAPLE) ×2
SUT MNCRL AB 4-0 PS2 18 (SUTURE) ×2
SUT NOVA NAB DX-16 0-1 5-0 T12 (SUTURE) ×2
SUT VIC AB 3-0 SH 27 (SUTURE) ×2
SUT VIC AB 3-0 SH 27X BRD (SUTURE) ×1
SYR BULB IRRIGATION 50ML (SYRINGE) ×2
SYR CONTROL 10ML LL (SYRINGE) ×2
TOWEL OR 17X24 6PK STRL BLUE (TOWEL DISPOSABLE) ×2
TOWEL OR 17X26 10 PK STRL BLUE (TOWEL DISPOSABLE) ×2
TUBE CONNECTING 12X1/4 (SUCTIONS)
YANKAUER SUCT BULB TIP NO VENT (SUCTIONS)

## 2014-12-28 NOTE — Anesthesia Preprocedure Evaluation (Signed)
Anesthesia Evaluation  Patient identified by MRN, date of birth, ID band Patient awake    Reviewed: Allergy & Precautions, NPO status , Patient's Chart, lab work & pertinent test results  Airway Mallampati: II  TM Distance: >3 FB Neck ROM: Full    Dental  (+) Teeth Intact, Dental Advisory Given   Pulmonary    breath sounds clear to auscultation       Cardiovascular  Rhythm:Regular Rate:Normal     Neuro/Psych    GI/Hepatic   Endo/Other    Renal/GU      Musculoskeletal   Abdominal (+) + obese,   Peds  Hematology   Anesthesia Other Findings   Reproductive/Obstetrics                             Anesthesia Physical Anesthesia Plan  ASA: III and emergent  Anesthesia Plan: General   Post-op Pain Management:    Induction: Intravenous  Airway Management Planned: Oral ETT  Additional Equipment:   Intra-op Plan:   Post-operative Plan: Extubation in OR  Informed Consent: I have reviewed the patients History and Physical, chart, labs and discussed the procedure including the risks, benefits and alternatives for the proposed anesthesia with the patient or authorized representative who has indicated his/her understanding and acceptance.   Dental advisory given  Plan Discussed with: CRNA and Anesthesiologist  Anesthesia Plan Comments: (Incarcerated ventral hernia  Plan GA with RSI  Roberts Gaudy)        Anesthesia Quick Evaluation

## 2014-12-28 NOTE — H&P (Signed)
History of Present Illness Andrea Aguirre; 12/28/2014 12:07 PM) Patient words: Patient states she's having recurrent umbilical hernia. Patient reports area is red and warm to touch.  The patient is a 64 year old female who presents with an umbilical hernia. Andrea Aguirre is a 64 year old patient who presents to the office in consultation today for a 1 day history of umbilical pain, tenderness and redness. Her history dates back about 35 years ago following delivery of one of her children when she noticed some bulging to her umbilical area. She was told she had an umbilical hernia which had never given her any trouble. About a year and a half ago she presented to the emergency department following a doctor's appointment for recurrence of this bulging area. She was seen in the emergency department and the hernia was reduced and she had no further problems. She recalls having a coughing episode yesterday and waking this morning with persistent pain and swelling to the umbilical area. She also noted that there was redness. She has no nausea, vomiting, or fever. She notices that she had not moved her bowels today. She has no previous surgical history other than a vaginal hysterectomy.   Other Problems [name], [name]; 12/28/2014 11:25 AM) Arthritis Hypercholesterolemia Umbilical Hernia Repair  Past Surgical History [name], [name]; 12/28/2014 11:25 AM) Hysterectomy (not due to cancer) - Partial Tonsillectomy  Diagnostic Studies History [name], [name]; 12/28/2014 11:25 AM) Colonoscopy never Mammogram 1-3 years ago Pap Smear 1-5 years ago  Allergies [name], [name]; 12/28/2014 11:35 AM) Valtrex *ANTIVIRALS* Codeine-Iodinated Glycerol *COUGH/COLD/ALLERGY* Patient can tolerates codeine when taking a Benadryl with Codeine TraMADol HCl *ANALGESICS - OPIOID* Itching. Patient states she can tolerate medication when taking Benadryl with tramadol  Medication  History [name], [name]; 12/28/2014 11:33 AM) Glutamine (500MG  Capsule, Oral) Active. Cholecalciferol-Vitamin C (1000-500UNIT-MG Capsule, Oral) Active.  Social History [name], [name]; 12/28/2014 11:25 AM) Alcohol use Occasional alcohol use. Caffeine use Tea. No drug use Tobacco use Former smoker.  Family History [name], [name]; 12/28/2014 11:25 AM) Arthritis Mother. Cancer Mother. Respiratory Condition Mother.  Pregnancy / Birth History [name], [name]; 12/28/2014 11:25 AM) Age at menarche 33 years. Age of menopause 29-60 Gravida 61 Maternal age 17-25 Para 3    Review of Systems [name]; 12/28/2014 11:52 AM) General Present- Fever. Not Present- Appetite Loss, Chills, Fatigue, Night Sweats, Weight Gain and Weight Loss. Skin Not Present- Change in Wart/Mole, Dryness, Hives, Jaundice, New Lesions, Non-Healing Wounds, Rash and Ulcer. HEENT Present- Seasonal Allergies, Sinus Pain and Sore Throat. Not Present- Earache, Hearing Loss, Hoarseness, Nose Bleed, Oral Ulcers, Ringing in the Ears, Visual Disturbances, Wears glasses/contact lenses and Yellow Eyes. Respiratory Not Present- Bloody sputum, Chronic Cough, Difficulty Breathing, Snoring and Wheezing. Breast Not Present- Breast Mass, Breast Pain, Nipple Discharge and Skin Changes. Cardiovascular Not Present- Chest Pain, Difficulty Breathing Lying Down, Leg Cramps, Palpitations, Rapid Heart Rate, Shortness of Breath and Swelling of Extremities. Gastrointestinal Present- Abdominal Pain and Excessive gas. Not Present- Bloating, Bloody Stool, Change in Bowel Habits, Chronic diarrhea, Constipation, Difficulty Swallowing, Gets full quickly at meals, Hemorrhoids, Indigestion, Nausea, Rectal Pain and Vomiting. Female Genitourinary Not Present- Frequency, Nocturia, Painful Urination, Pelvic Pain and Urgency. Musculoskeletal Present- Joint Pain. Not Present- Back Pain, Joint Stiffness, Muscle Pain, Muscle  Weakness and Swelling of Extremities. Neurological Not Present- Decreased Memory, Fainting, Headaches, Numbness, Seizures, Tingling, Tremor, Trouble walking and Weakness. Psychiatric Not Present- Anxiety, Bipolar, Change in Sleep Pattern, Depression, Fearful and Frequent  crying. Endocrine Not Present- Cold Intolerance, Excessive Hunger, Hair Changes, Heat Intolerance, Hot flashes and New Diabetes. Hematology Not Present- Easy Bruising, Excessive bleeding, Gland problems, HIV and Persistent Infections.  Vitals [name] [name]; 12/28/2014 11:25 AM) 12/28/2014 11:24 AM Weight: 167.2 lb Temp.: 98.51F(Temporal)  Pulse: 80 (Regular)  Resp.: 16 (Unlabored)  BP: 142/84 (Sitting, Left Arm, Standard)       Physical Exam ([name]; 12/28/2014 11:48 AM) General Mental Status-Alert. General Appearance-Consistent with stated age. Hydration-Well hydrated. Voice-Normal.  Head and Neck Head-normocephalic, atraumatic with no lesions or palpable masses. Trachea-midline.  Eye Eyeball - Bilateral-Extraocular movements intact. Sclera/Conjunctiva - Bilateral-No scleral icterus.  Chest and Lung Exam Chest and lung exam reveals -quiet, even and easy respiratory effort with no use of accessory muscles and on auscultation, normal breath sounds, no adventitious sounds and normal vocal resonance. Inspection Chest Wall - Normal. Back - normal.  Cardiovascular Cardiovascular examination reveals -normal heart sounds, regular rate and rhythm with no murmurs and normal pedal pulses bilaterally.  Abdomen Inspection Skin - Scar - no surgical scars. Hernias - Ventral - Incarcerated. Note: Tenderness to umbilicus with approx. 10 cm erythema. No crepitance. Not reduceable. Palpation/Percussion Palpation and Percussion of the abdomen reveal - Soft, Non Tender, No Rebound tenderness, No Rigidity (guarding) and No hepatosplenomegaly. Auscultation Auscultation of the  abdomen reveals - Bowel sounds normal.  Neurologic Neurologic evaluation reveals -alert and oriented x 3 with no impairment of recent or remote memory. Mental Status-Normal.  Musculoskeletal Normal Exam - Left-Upper Extremity Strength Normal and Lower Extremity Strength Normal. Normal Exam - Right-Upper Extremity Strength Normal, Lower Extremity Weakness.    Assessment & Plan Jonni Sanger Liepins PA C; 67/67/2094 70:96 AM) UMBILICAL HERNIA, INCARCERATED (K42.0) Impression: The patient was examined with Dr. Grandville Aguirre. The patient will need to be admitted directly to the hospital for likely laparoscopic hernia repair. Dr. Ninfa Linden, who is the doctor of the week at [facility], was consulted and he will be accepting the patient. We discussed the need for surgery with the patient who voiced understanding.  History and Physical Note Chart Review Note Zenovia Jarred Aguirre; 12/28/2014 12:10 PM) I have reviewed the history and physical note and findings.   PLAN DIRECT ADMISSION TO [facility] as above and I discussed this with Dr. Nedra Hai. Georganna Skeans, Aguirre, MPH, FACS Trauma: [phone] General Surgery: [phone]

## 2014-12-28 NOTE — Transfer of Care (Signed)
Immediate Anesthesia Transfer of Care Note  Patient: Andrea Aguirre  Procedure(s) Performed: Procedure(s): HERNIA REPAIR UMBILICAL ADULT/INCARCERATED (N/A)  Patient Location: PACU  Anesthesia Type:General  Level of Consciousness: awake, alert  and oriented  Airway & Oxygen Therapy: Patient Spontanous Breathing and Patient connected to face mask oxygen  Post-op Assessment: Report given to RN and Post -op Vital signs reviewed and stable  Post vital signs: Reviewed and stable  Last Vitals:  Filed Vitals:   12/28/14 1635  BP: 148/68  Pulse: 109  Temp:   Resp: 25    Complications: No apparent anesthesia complications

## 2014-12-28 NOTE — Op Note (Signed)
[name], [name] NO.:  [id number]  MEDICAL RECORD NO.:  [id number]  LOCATION:  [id number]                        FACILITY:  [facility]  PHYSICIAN:  Coralie Keens, M.D. DATE OF BIRTH:  27-Sep-1950  DATE OF PROCEDURE:  12/28/2014 DATE OF DISCHARGE:                              OPERATIVE REPORT   PREOPERATIVE DIAGNOSIS:  Incarcerated umbilical hernia.  POSTOPERATIVE DIAGNOSIS:  Incarcerated umbilical hernia.  PROCEDURE:  Repair of incarcerated umbilical hernia.  SURGEON:  Coralie Keens, M.D.  ANESTHESIA:  General and 0.5% Marcaine.  ESTIMATED BLOOD LOSS:  Minimal.  INDICATIONS:  This is a 64 year old female with a known umbilical hernia who presented to our office with incarceration and erythema of the skin with periumbilical discomfort.  Decision was made to proceed emergently to the operating room.  FINDINGS:  The patient was found to have incarcerated omentum and the 2 cm fascial defect with cellulitis of the skin.  The hernia was repaired primarily.  PROCEDURE IN DETAIL:  The patient was brought to the operating room, identified as Andrea Aguirre.  She was placed supine on the operating room table and general anesthesia was induced.  Her abdomen was then prepped and draped in usual sterile fashion.  I made a small vertical incision above the umbilicus.  I carried this down to the large hernia sac, which was easily identified.  The patient had a large amount of omentum incarcerated in the hernia sac.  The actual fascial defect was approximately 2 cm in size.  I excised the sac and some of the omentum and reduced the rest into the abdominal cavity.  I then freed up the fascia circumferentially.  I then closed the fascial defect with 2 separate figure-of-eight #1 Novafil sutures.  I then thoroughly irrigated the wound with saline.  Hemostasis appeared to be achieved with cautery.  I anesthetized the fascia and skin with Marcaine.  I then closed the  subcutaneous tissues loosely with a couple of interrupted 3-0 Vicryl sutures and closed the skin with skin staples. Gauze and tape were then applied.  The patient tolerated the procedure well.  All sponge, needle, and instrument counts were correct at the end of procedure.  The patient was then extubated in the operating room and taken in a stable condition to the recovery room.     Coralie Keens, M.D.     DB/MEDQ  D:  12/28/2014  T:  12/28/2014  Job:  [id number]

## 2014-12-28 NOTE — Anesthesia Procedure Notes (Signed)
Procedure Name: Intubation Date/Time: 12/28/2014 3:50 PM Performed by: YACOUB, ALISHA B Pre-anesthesia Checklist: Patient identified, Emergency Drugs available, Suction available, Patient being monitored and Timeout performed Patient Re-evaluated:Patient Re-evaluated prior to inductionOxygen Delivery Method: Circle system utilized Preoxygenation: Pre-oxygenation with 100% oxygen Intubation Type: IV induction and Rapid sequence Laryngoscope Size: Mac and 3 Grade View: Grade I Tube type: Oral Tube size: 7.0 mm Number of attempts: 1 Airway Equipment and Method: Stylet and LTA kit utilized Placement Confirmation: ETT inserted through vocal cords under direct vision,  positive ETCO2 and breath sounds checked- equal and bilateral Secured at: 20 cm Tube secured with: Tape Dental Injury: Teeth and Oropharynx as per pre-operative assessment      

## 2014-12-28 NOTE — Op Note (Signed)
HERNIA REPAIR UMBILICAL ADULT/INCARCERATED  Procedure Note  Andrea Aguirre 12/28/2014   Pre-op Diagnosis: Incarcerated Umbilical Hernia     Post-op Diagnosis: same  Procedure(s): HERNIA REPAIR UMBILICAL ADULT/INCARCERATED  Surgeon(s): Coralie Keens, MD  Anesthesia: General  Staff:  Circulator: Kara Mead, RN Scrub Person: Jenelle Mages Panchit  Estimated Blood Loss: Minimal                         [name]   Date: 12/28/2014  Time: 4:15 PM

## 2014-12-28 NOTE — Progress Notes (Signed)
Patient ID: Andrea Aguirre, female   DOB: 14-Mar-1950, 64 y.o.   MRN: [id number]  Patient with an incarcerated umbilical hernia with erythema of the skin and tenderness. I suspect it has incarcerated omentum Urgent repair is recommended.  I explained to the patient that I could not use mesh because of the skin cellulitis. I discussed the risk of surgery including but not limited to bleeding, infection, injury to surrounding structures, recurrent hernia, DVT, etc. She agrees to proceed.

## 2014-12-29 ENCOUNTER — Encounter (HOSPITAL_COMMUNITY): Payer: Self-pay | Admitting: Surgery

## 2014-12-29 DIAGNOSIS — K420 Umbilical hernia with obstruction, without gangrene: Secondary | ICD-10-CM | POA: Diagnosis not present

## 2014-12-29 MED ORDER — HYDROCODONE-ACETAMINOPHEN 5-325 MG PO TABS: 1.0000 | ORAL_TABLET | ORAL | Status: DC | PRN

## 2014-12-29 MED ORDER — PHENOL 1.4 % MT LIQD
1.0000 | OROMUCOSAL | Status: DC | PRN
  Filled 2014-12-29: qty 177

## 2014-12-29 MED ORDER — CEPHALEXIN 500 MG PO CAPS: 500.0000 mg | ORAL_CAPSULE | Freq: Four times a day (QID) | ORAL | Status: DC

## 2014-12-29 NOTE — Progress Notes (Signed)
Patient discharged to home with instructions. 

## 2014-12-29 NOTE — Discharge Summary (Signed)
Patient ID: AZANA KIESLER MRN: [id number] DOB/AGE: 13-Aug-1950 64 y.o.  Admit date: 12/28/2014 Discharge date: 12/29/2014  Procedures: repair of incarcerated umbilical hernia  Consults: None  Reason for Admission:  The patient is a 64 year old female who presents with an umbilical hernia. Mrs. Zuch is a 64 year old patient who presents to the office in consultation today for a 1 day history of umbilical pain, tenderness and redness. Her history dates back about 35 years ago following delivery of one of her children when she noticed some bulging to her umbilical area. She was told she had an umbilical hernia which had never given her any trouble. About a year and a half ago she presented to the emergency department following a doctor's appointment for recurrence of this bulging area. She was seen in the emergency department and the hernia was reduced and she had no further problems. She recalls having a coughing episode yesterday and waking this morning with persistent pain and swelling to the umbilical area. She also noted that there was redness. She has no nausea, vomiting, or fever. She notices that she had not moved her bowels today. She has no previous surgical history other than a vaginal hysterectomy  Admission Diagnoses:  1. Incarcerated umbilical hernia  Hospital Course: the patient was admitted and taken to the OR where she underwent the above procedure.  She tolerated this well.  On POD 1, her pain was controlled and she was tolerating a regular diet.  She was stable for dc home.  She did have some erythema present at her umbilicus at the time of surgery, so she will be DC home on 5 days of Keflex  PE: Abd: soft, appropriately tender, +BS, ND, incision is covered with gauze and tegaderm  Discharge Diagnoses:  Active Problems:   Incarcerated umbilical hernia s/p repair of UH  Discharge Medications:   Medication List    TAKE these medications        cephALEXin 500  MG capsule  Commonly known as:  KEFLEX  Take 1 capsule (500 mg total) by mouth 4 (four) times daily.     HYDROcodone-acetaminophen 5-325 MG tablet  Commonly known as:  NORCO/VICODIN  Take 1-2 tablets by mouth every 4 (four) hours as needed for moderate pain.     pseudoephedrine 30 MG tablet  Commonly known as:  SUDAFED  Take 30 mg by mouth every 4 (four) hours as needed for congestion.     VITAMIN D PO  Take 5,000 Units by mouth daily.        Discharge Instructions:     Follow-up Information    Follow up with [facility], MD In 10 days.   Specialty:  General Surgery   Why:  our office will call you   Contact information:   [address] [place] [address] [phone]       Signed: Henreitta Cea 12/29/2014, 10:18 AM

## 2014-12-29 NOTE — Discharge Instructions (Signed)
[facility] _______[facility]  UMBILICAL OR INGUINAL HERNIA REPAIR: POST OP INSTRUCTIONS  Always review your discharge instruction sheet given to you by the facility where your surgery was performed. IF YOU HAVE DISABILITY OR FAMILY LEAVE FORMS, YOU MUST BRING THEM TO THE OFFICE FOR PROCESSING.   DO NOT GIVE THEM TO YOUR DOCTOR.  1. A  prescription for pain medication may be given to you upon discharge.  Take your pain medication as prescribed, if needed.  If narcotic pain medicine is not needed, then you may take acetaminophen (Tylenol) or ibuprofen (Advil) as needed. 2. Take your usually prescribed medications unless otherwise directed. 3. If you need a refill on your pain medication, please contact your pharmacy.  They will contact our office to request authorization. Prescriptions will not be filled after 5 pm or on week-ends. 4. You should follow a light diet the first 24 hours after arrival home, such as soup and crackers, etc.  Be sure to include lots of fluids daily.  Resume your normal diet the day after surgery. 5. Most patients will experience some swelling and bruising around the umbilicus or in the groin and scrotum.  Ice packs and reclining will help.  Swelling and bruising can take several days to resolve.  6. It is common to experience some constipation if taking pain medication after surgery.  Increasing fluid intake and taking a stool softener (such as Colace) will usually help or prevent this problem from occurring.  A mild laxative (Milk of Magnesia or Miralax) should be taken according to package directions if there are no bowel movements after 48 hours. 7. Unless discharge instructions indicate otherwise, you may remove your bandages 24-48 hours after surgery, and you may shower at that time.  You may have steri-strips (small skin tapes) in place directly over the incision.  These strips should be left on the skin for 7-10 days.  If your surgeon used skin glue on the  incision, you may shower in 24 hours.  The glue will flake off over the next 2-3 weeks.  Any sutures or staples will be removed at the office during your follow-up visit. 8. ACTIVITIES:  You may resume regular (light) daily activities beginning the next day--such as daily self-care, walking, climbing stairs--gradually increasing activities as tolerated.  You may have sexual intercourse when it is comfortable.  Refrain from any heavy lifting or straining until approved by your doctor. a. You may drive when you are no longer taking prescription pain medication, you can comfortably wear a seatbelt, and you can safely maneuver your car and apply brakes. b. RETURN TO WORK:  __________________________________________________________ 9. You should see your doctor in the office for a follow-up appointment approximately 2-3 weeks after your surgery.  Make sure that you call for this appointment within a day or two after you arrive home to insure a convenient appointment time. 10. OTHER INSTRUCTIONS:  __________________________________________________________________________________________________________________________________________________________________________________________  WHEN TO CALL YOUR DOCTOR: 1. Fever over 101.0 2. Inability to urinate 3. Nausea and/or vomiting 4. Extreme swelling or bruising 5. Continued bleeding from incision. 6. Increased pain, redness, or drainage from the incision  The clinic staff is available to answer your questions during regular business hours.  Please don't hesitate to call and ask to speak to one of the nurses for clinical concerns.  If you have a medical emergency, go to the nearest emergency room or call 911.  A surgeon from [facility] is always on call at the hospital     [address], [address], [place], [place]  [address] ?  [address] [address], [place], [place]   [address] [phone] ? [phone] ? FAX [phone] Web site:  [email or web address]  

## 2014-12-29 NOTE — Anesthesia Postprocedure Evaluation (Signed)
  Anesthesia Post-op Note  Patient: Andrea Aguirre  Procedure(s) Performed: Procedure(s): HERNIA REPAIR UMBILICAL ADULT/INCARCERATED (N/A)  Patient Location: PACU  Anesthesia Type:General  Level of Consciousness: awake  Airway and Oxygen Therapy: Patient Spontanous Breathing  Post-op Pain: mild  Post-op Assessment: Post-op Vital signs reviewed              Post-op Vital Signs: Reviewed  Last Vitals:  Filed Vitals:   12/29/14 0855  BP: 111/59  Pulse: 65  Temp: 36.8 C  Resp: 17    Complications: No apparent anesthesia complications

## 2015-01-30 ENCOUNTER — Ambulatory Visit (INDEPENDENT_AMBULATORY_CARE_PROVIDER_SITE_OTHER): Payer: PRIVATE HEALTH INSURANCE | Admitting: Family Medicine

## 2015-01-30 VITALS — BP 146/72 | HR 96 | Temp 98.2°F | Resp 18 | Ht 62.0 in | Wt 169.4 lb

## 2015-01-30 DIAGNOSIS — R05 Cough: Secondary | ICD-10-CM

## 2015-01-30 DIAGNOSIS — H9201 Otalgia, right ear: Secondary | ICD-10-CM

## 2015-01-30 DIAGNOSIS — H65191 Other acute nonsuppurative otitis media, right ear: Secondary | ICD-10-CM

## 2015-01-30 DIAGNOSIS — R059 Cough, unspecified: Secondary | ICD-10-CM

## 2015-01-30 MED ORDER — AMOXICILLIN-POT CLAVULANATE 875-125 MG PO TABS: 1.0000 | ORAL_TABLET | Freq: Two times a day (BID) | ORAL | Status: DC

## 2015-01-30 MED ORDER — OFLOXACIN 0.3 % OT SOLN: 5.0000 [drp] | Freq: Every day | OTIC | Status: DC

## 2015-01-30 NOTE — Patient Instructions (Signed)
Drink plenty of fluids  Take the Augmentin (amoxicillin/clavulanate) 875 mg twice daily  Use the Floxin otic drops (ofloxacin) 5 drops twice daily in right ear  Take the hydrocodone that you have at home every 4-6 hours if needed for pain  Continue using the pseudoephedrine for the next 2-3 days to try and help open the eustachian tube  Return if worse

## 2015-01-30 NOTE — Progress Notes (Signed)
Patient ID: Andrea Aguirre, female    DOB: 12-17-1950  Age: 64 y.o. MRN: [id number]  Chief Complaint  Patient presents with  . Ear Pain    right ear, since yesterday     Subjective:   Patient had a hernia surgery several weeks ago, has recovered well from that. She was out in [place] 2 weeks ago and had some otalgia so she went to a clinic. Was told that her ear looked fine. She now has a little bit of a cold. She developed acute severe right ear pain last night and some funny noises in it.  Current allergies, medications, problem list, past/family and social histories reviewed.  Objective:  BP 146/72 mmHg  Pulse 96  Temp(Src) 98.2 F (36.8 C) (Oral)  Resp 18  Ht 5\' 2"  (1.575 m)  Wt 169 lb 6.4 oz (76.839 kg)  BMI 30.98 kg/m2  SpO2 96%  TM on the left is normal the right is a very bad looking here with blistering of the drum and bright red drum and periphery. Throat clear. Neck supple without nodes. Chest clear. Heart regular without murmurs.  Assessment & Plan:   Assessment: 1. Acute nonsuppurative otitis media of right ear   2. Otalgia of right ear   3. Cough       Plan: Treat with oral and with drops because of the blistering and involvement of the edge of the canal.   Meds ordered this encounter  Medications  . amoxicillin-clavulanate (AUGMENTIN) 875-125 MG tablet    Sig: Take 1 tablet by mouth 2 (two) times daily.    Dispense:  20 tablet    Refill:  0  . ofloxacin (FLOXIN OTIC) 0.3 % otic solution    Sig: Place 5 drops into the right ear daily.    Dispense:  5 mL    Refill:  0         Patient Instructions  Drink plenty of fluids  Take the Augmentin (amoxicillin/clavulanate) 875 mg twice daily  Use the Floxin otic drops (ofloxacin) 5 drops twice daily in right ear  Take the hydrocodone that you have at home every 4-6 hours if needed for pain  Continue using the pseudoephedrine for the next 2-3 days to try and help open the eustachian  tube  Return if worse    Return if symptoms worsen or fail to improve.   Oluwademilade Mckiver, MD 01/30/2015

## 2015-05-30 DIAGNOSIS — H9193 Unspecified hearing loss, bilateral: Secondary | ICD-10-CM | POA: Insufficient documentation

## 2016-05-20 DIAGNOSIS — E559 Vitamin D deficiency, unspecified: Secondary | ICD-10-CM | POA: Diagnosis not present

## 2016-05-20 DIAGNOSIS — Z23 Encounter for immunization: Secondary | ICD-10-CM | POA: Diagnosis not present

## 2016-05-20 DIAGNOSIS — M797 Fibromyalgia: Secondary | ICD-10-CM | POA: Diagnosis not present

## 2016-05-20 DIAGNOSIS — E785 Hyperlipidemia, unspecified: Secondary | ICD-10-CM | POA: Diagnosis not present

## 2016-05-20 DIAGNOSIS — M1990 Unspecified osteoarthritis, unspecified site: Secondary | ICD-10-CM | POA: Diagnosis not present

## 2016-05-20 DIAGNOSIS — Z0000 Encounter for general adult medical examination without abnormal findings: Secondary | ICD-10-CM | POA: Diagnosis not present

## 2016-05-20 DIAGNOSIS — R69 Illness, unspecified: Secondary | ICD-10-CM | POA: Diagnosis not present

## 2016-05-20 DIAGNOSIS — I10 Essential (primary) hypertension: Secondary | ICD-10-CM | POA: Diagnosis not present

## 2016-05-20 DIAGNOSIS — Z1389 Encounter for screening for other disorder: Secondary | ICD-10-CM | POA: Diagnosis not present

## 2016-06-03 DIAGNOSIS — J22 Unspecified acute lower respiratory infection: Secondary | ICD-10-CM | POA: Diagnosis not present

## 2016-06-03 DIAGNOSIS — J9801 Acute bronchospasm: Secondary | ICD-10-CM | POA: Diagnosis not present

## 2016-07-30 DIAGNOSIS — L989 Disorder of the skin and subcutaneous tissue, unspecified: Secondary | ICD-10-CM | POA: Diagnosis not present

## 2016-11-26 DIAGNOSIS — J209 Acute bronchitis, unspecified: Secondary | ICD-10-CM | POA: Diagnosis not present

## 2017-01-21 DIAGNOSIS — E669 Obesity, unspecified: Secondary | ICD-10-CM | POA: Diagnosis not present

## 2017-01-21 DIAGNOSIS — Z0000 Encounter for general adult medical examination without abnormal findings: Secondary | ICD-10-CM | POA: Diagnosis not present

## 2017-01-21 DIAGNOSIS — Z973 Presence of spectacles and contact lenses: Secondary | ICD-10-CM | POA: Diagnosis not present

## 2017-01-21 DIAGNOSIS — Z6832 Body mass index (BMI) 32.0-32.9, adult: Secondary | ICD-10-CM | POA: Diagnosis not present

## 2017-05-29 DIAGNOSIS — E785 Hyperlipidemia, unspecified: Secondary | ICD-10-CM | POA: Diagnosis not present

## 2017-05-29 DIAGNOSIS — E559 Vitamin D deficiency, unspecified: Secondary | ICD-10-CM | POA: Diagnosis not present

## 2017-05-29 DIAGNOSIS — R69 Illness, unspecified: Secondary | ICD-10-CM | POA: Diagnosis not present

## 2017-05-29 DIAGNOSIS — I10 Essential (primary) hypertension: Secondary | ICD-10-CM | POA: Diagnosis not present

## 2017-05-29 DIAGNOSIS — Z1389 Encounter for screening for other disorder: Secondary | ICD-10-CM | POA: Diagnosis not present

## 2017-05-29 DIAGNOSIS — E2839 Other primary ovarian failure: Secondary | ICD-10-CM | POA: Diagnosis not present

## 2017-05-29 DIAGNOSIS — R5383 Other fatigue: Secondary | ICD-10-CM | POA: Diagnosis not present

## 2017-05-29 DIAGNOSIS — Z1211 Encounter for screening for malignant neoplasm of colon: Secondary | ICD-10-CM | POA: Diagnosis not present

## 2017-05-29 DIAGNOSIS — Z0000 Encounter for general adult medical examination without abnormal findings: Secondary | ICD-10-CM | POA: Diagnosis not present

## 2017-07-02 ENCOUNTER — Other Ambulatory Visit: Payer: Self-pay | Admitting: Family Medicine

## 2017-07-02 DIAGNOSIS — M8580 Other specified disorders of bone density and structure, unspecified site: Secondary | ICD-10-CM

## 2017-07-02 DIAGNOSIS — Z139 Encounter for screening, unspecified: Secondary | ICD-10-CM

## 2017-07-02 DIAGNOSIS — E2839 Other primary ovarian failure: Secondary | ICD-10-CM

## 2017-07-10 DIAGNOSIS — E7800 Pure hypercholesterolemia, unspecified: Secondary | ICD-10-CM | POA: Diagnosis not present

## 2017-07-10 DIAGNOSIS — R7989 Other specified abnormal findings of blood chemistry: Secondary | ICD-10-CM | POA: Diagnosis not present

## 2017-08-06 ENCOUNTER — Ambulatory Visit
Admission: RE | Admit: 2017-08-06 | Discharge: 2017-08-06 | Disposition: A | Discharge status: Home / Self Care | Payer: Medicare HMO | Source: Ambulatory Visit | Attending: Family Medicine | Admitting: Family Medicine

## 2017-08-06 DIAGNOSIS — Z1231 Encounter for screening mammogram for malignant neoplasm of breast: Secondary | ICD-10-CM | POA: Diagnosis not present

## 2017-08-06 DIAGNOSIS — M8580 Other specified disorders of bone density and structure, unspecified site: Secondary | ICD-10-CM

## 2017-08-06 DIAGNOSIS — Z780 Asymptomatic menopausal state: Secondary | ICD-10-CM | POA: Diagnosis not present

## 2017-08-06 DIAGNOSIS — M85851 Other specified disorders of bone density and structure, right thigh: Secondary | ICD-10-CM | POA: Diagnosis not present

## 2017-08-06 DIAGNOSIS — E2839 Other primary ovarian failure: Secondary | ICD-10-CM

## 2017-08-06 DIAGNOSIS — Z139 Encounter for screening, unspecified: Secondary | ICD-10-CM

## 2017-10-16 DIAGNOSIS — M1811 Unilateral primary osteoarthritis of first carpometacarpal joint, right hand: Secondary | ICD-10-CM | POA: Diagnosis not present

## 2017-10-30 ENCOUNTER — Ambulatory Visit: Payer: Medicare HMO | Admitting: Podiatry

## 2017-10-30 ENCOUNTER — Ambulatory Visit (INDEPENDENT_AMBULATORY_CARE_PROVIDER_SITE_OTHER): Payer: Medicare HMO

## 2017-10-30 ENCOUNTER — Ambulatory Visit: Payer: Medicare HMO

## 2017-10-30 ENCOUNTER — Encounter: Payer: Self-pay | Admitting: Podiatry

## 2017-10-30 DIAGNOSIS — M779 Enthesopathy, unspecified: Secondary | ICD-10-CM

## 2017-10-30 DIAGNOSIS — M722 Plantar fascial fibromatosis: Secondary | ICD-10-CM

## 2017-10-30 DIAGNOSIS — M7661 Achilles tendinitis, right leg: Secondary | ICD-10-CM | POA: Insufficient documentation

## 2017-10-30 DIAGNOSIS — M7751 Other enthesopathy of right foot: Secondary | ICD-10-CM

## 2017-10-30 MED ORDER — TRIAMCINOLONE ACETONIDE 10 MG/ML IJ SUSP
10.0000 mg | Freq: Once | INTRAMUSCULAR | Status: AC
  Administered 2017-10-30: 10 mg

## 2017-10-30 MED ORDER — MELOXICAM 7.5 MG PO TABS: 7.5000 mg | ORAL_TABLET | Freq: Every day | ORAL | 0 refills | Status: DC

## 2017-10-30 NOTE — Patient Instructions (Signed)

## 2017-10-30 NOTE — Progress Notes (Signed)
Subjective:    Patient ID: Andrea Aguirre, female    DOB: [birth year], 67 y.o.   MRN: [id number]  HPI 67 year old female presents the office today for concerns of right foot pain worse than left that the pain can last 7 days she describes pain to the bottom of her heel but she also states that treatment of the Achilles tendon.  She denies any recent injury or trauma she denies any swelling.  She is not sure if she has an Achilles tendon issue or if the plantar fascia.  No numbness or tingling.  The pain does not wake her up at night.  No other concerns.   Review of Systems  All other systems reviewed and are negative.  Past Medical History:  Diagnosis Date  . Anxiety    "just when I fly" (12/28/2014)  . Chronic fatigue syndrome    "hx; did natural medicine; been cleared up since 06-Jun-2008"  . Depression    "took antidepressant when my mom died in 06/07/95 for 60-90 days; not on RX since" (12/28/2014)  . Elevated LFTs   . Fibromyalgia    "hx; did natural medicine; been cleared up since 06-Jun-2008"  . Hypercholesterolemia   . Osteoarthritis    "maybe a little for my age" (12/28/2014)  . Pneumonia    "probably 3 times in the last 30 years" (12/28/2014)    Past Surgical History:  Procedure Laterality Date  . HERNIA REPAIR    . TONSILLECTOMY  1965  . UMBILICAL HERNIA REPAIR  12/28/2014   HERNIA REPAIR UMBILICAL ADULT/INCARCERATED  . UMBILICAL HERNIA REPAIR N/A 12/28/2014   Procedure: HERNIA REPAIR UMBILICAL ADULT/INCARCERATED;  Surgeon: Coralie Keens, MD;  Location: [facility];  Service: General;  Laterality: N/A;  . VAGINAL HYSTERECTOMY  ~ 1985     Current Outpatient Medications:  .  meloxicam (MOBIC) 7.5 MG tablet, Take 1 tablet (7.5 mg total) by mouth daily., Disp: 30 tablet, Rfl: 0 No current facility-administered medications for this visit.   Facility-Administered Medications Ordered in Other Visits:  .  dextrose 5 % and 0.45 % NaCl with KCl 20 mEq/L infusion, , Intravenous,  Continuous, Georganna Skeans, MD .  fentaNYL (SUBLIMAZE) injection 25 mcg, 25 mcg, Intravenous, Q1H PRN, Georganna Skeans, MD .  ondansetron (ZOFRAN-ODT) disintegrating tablet 4 mg, 4 mg, Oral, Q6H PRN **OR** ondansetron (ZOFRAN) 4 mg in sodium chloride 0.9 % 50 mL IVPB, 4 mg, Intravenous, Q6H PRN, Georganna Skeans, MD  Allergies  Allergen Reactions  . Codeine Itching  . Tramadol Itching  . Valacyclovir Hcl Hives        Objective:   Physical Exam General: AAO x3, NAD  Dermatological: Skin is warm, dry and supple bilateral. Nails x 10 are well manicured; remaining integument appears unremarkable at this time. There are no open sores, no preulcerative lesions, no rash or signs of infection present.  Vascular: Dorsalis Pedis artery and Posterior Tibial artery pedal pulses are 2/4 bilateral with immedate capillary fill time. Pedal hair growth present. No varicosities and no lower extremity edema present bilateral. There is no pain with calf compression, swelling, warmth, erythema.   Neruologic: Grossly intact via light touch bilateral.  Protective threshold with Semmes Wienstein monofilament intact to all pedal sites bilateral.  Negative Tinel sign  Musculoskeletal: Tenderness to palpation along the plantar medial tubercle of the calcaneus at the insertion of plantar fascia on the right >> leftfoot. There is no pain along the course of the plantar fascia within the arch of the foot.  Plantar fascia appears to be intact. There is no pain with lateral compression of the calcaneus or pain with vibratory sensation.  There is minimal discomfort on the distal portion of the Achilles tendon into the insertion of the calcaneus and the right side.  Thompson test negative.  Minimal discomfort on the course the peroneal tendons on the right side but overall the Achilles tendon, peroneal tendons intact.  No other areas of tenderness to bilateral lower extremities.l. Muscular strength 5/5 in all groups tested  bilateral.  Gait: Unassisted, Nonantalgic.      Assessment & Plan:  67 year old female with right plantar fasciitis, Achilles tendinitis -Treatment options discussed including all alternatives, risks, and complications -Etiology of symptoms were discussed -X-rays were obtained and reviewed with the patient.  On the right side there is some minimal calcification of the distal portion of the Achilles tendon on the posterior calcaneus, inferior calcaneal spurs present bilaterally.  Arthritic changes present the first MPJ. -Steroid injection on the right side performed.  See procedure note below. -Plantar fascial braces dispensed x2 -Night Splint -Discussed shoe modifications and orthotics.  She is recently purchase new shoes which is been helpful. -Prescribed mobic. Discussed side effects of the medication and directed to stop if any are to occur and call the office.   Procedure: Injection Tendon/Ligament Discussed alternatives, risks, complications and verbal consent was obtained.  Location: Right plantar fascia at the glabrous junction; medial approach. Skin Prep: Alcohol. Injectate: 0.5cc 0.5% marcaine plain, 0.5 cc 2% lidocaine plain and, 1 cc kenalog 10. Disposition: Patient tolerated procedure well. Injection site dressed with a band-aid.  Post-injection care was discussed and return precautions discussed.   Trula Slade DPM

## 2017-11-27 ENCOUNTER — Ambulatory Visit: Payer: Medicare HMO | Admitting: Podiatry

## 2017-11-27 ENCOUNTER — Encounter: Payer: Self-pay | Admitting: Podiatry

## 2017-11-27 DIAGNOSIS — M722 Plantar fascial fibromatosis: Secondary | ICD-10-CM

## 2017-12-01 NOTE — Progress Notes (Signed)
Subjective: 67 year old female presents the office today for follow-up evaluation of right foot pain.  She states that she is getting better and the injection did help but she cannot wear the brace has been working and she wants to proceed with orthotics today.  No recent injury changes since I last saw her.  Denies any systemic complaints such as fevers, chills, nausea, vomiting. No acute changes since last appointment, and no other complaints at this time.   Objective: AAO x3, NAD DP/PT pulses palpable bilaterally, CRT less than 3 seconds There is minimal tenderness palpation on the plantar medial tubercle of the calcaneus at the insertion of plantar fascia on the right foot.  Plantar fascia appears to be intact, Achilles tendon intact.  There is no pain with lateral compression of the calcaneus.  No other areas of tenderness identified at this time.  No edema, erythema.  No open lesions or pre-ulcerative lesions.  No pain with calf compression, swelling, warmth, erythema  Assessment: Plantar fasciitis, with improvement  Plan: -All treatment options discussed with the patient including all alternatives, risks, complications.  -Today she is requesting orthotics.  I have Liliane Channel evaluate her today she was molded for the inserts.  Continue with stretching, icing daily as well as wearing supportive shoes. -Patient encouraged to call the office with any questions, concerns, change in symptoms.  -Follow-up in 3 weeks to pick up the inserts or sooner if needed.  Trula Slade DPM

## 2017-12-02 DIAGNOSIS — H52223 Regular astigmatism, bilateral: Secondary | ICD-10-CM | POA: Diagnosis not present

## 2017-12-02 DIAGNOSIS — H04123 Dry eye syndrome of bilateral lacrimal glands: Secondary | ICD-10-CM | POA: Diagnosis not present

## 2017-12-02 DIAGNOSIS — H524 Presbyopia: Secondary | ICD-10-CM | POA: Diagnosis not present

## 2017-12-02 DIAGNOSIS — H5203 Hypermetropia, bilateral: Secondary | ICD-10-CM | POA: Diagnosis not present

## 2017-12-02 DIAGNOSIS — H00012 Hordeolum externum right lower eyelid: Secondary | ICD-10-CM | POA: Diagnosis not present

## 2017-12-16 DIAGNOSIS — H00022 Hordeolum internum right lower eyelid: Secondary | ICD-10-CM | POA: Diagnosis not present

## 2017-12-18 ENCOUNTER — Ambulatory Visit (INDEPENDENT_AMBULATORY_CARE_PROVIDER_SITE_OTHER): Payer: Medicare HMO | Admitting: Orthotics

## 2017-12-18 DIAGNOSIS — M722 Plantar fascial fibromatosis: Secondary | ICD-10-CM

## 2017-12-18 DIAGNOSIS — M7661 Achilles tendinitis, right leg: Secondary | ICD-10-CM

## 2017-12-18 NOTE — Progress Notes (Signed)
Patient came in today to p/up functional foot orthotics.   The orthotics were assessed to both fit and function.  The F/O addressed the biomechanical issues/pathologies as intended, offering good longitudinal arch support, proper offloading, and foot support. There weren't any signs of discomfort or irritation.  The F/O fit properly in footwear with minimal trimming/adjustments. 

## 2018-09-16 DIAGNOSIS — M4003 Postural kyphosis, cervicothoracic region: Secondary | ICD-10-CM | POA: Diagnosis not present

## 2018-09-16 DIAGNOSIS — M5032 Other cervical disc degeneration, mid-cervical region, unspecified level: Secondary | ICD-10-CM | POA: Diagnosis not present

## 2018-09-16 DIAGNOSIS — Q72812 Congenital shortening of left lower limb: Secondary | ICD-10-CM | POA: Diagnosis not present

## 2018-09-16 DIAGNOSIS — M9905 Segmental and somatic dysfunction of pelvic region: Secondary | ICD-10-CM | POA: Diagnosis not present

## 2018-09-16 DIAGNOSIS — M5134 Other intervertebral disc degeneration, thoracic region: Secondary | ICD-10-CM | POA: Diagnosis not present

## 2018-09-16 DIAGNOSIS — M9901 Segmental and somatic dysfunction of cervical region: Secondary | ICD-10-CM | POA: Diagnosis not present

## 2018-09-16 DIAGNOSIS — M5136 Other intervertebral disc degeneration, lumbar region: Secondary | ICD-10-CM | POA: Diagnosis not present

## 2018-09-25 DIAGNOSIS — L720 Epidermal cyst: Secondary | ICD-10-CM | POA: Diagnosis not present

## 2018-09-25 DIAGNOSIS — I788 Other diseases of capillaries: Secondary | ICD-10-CM | POA: Diagnosis not present

## 2018-09-25 DIAGNOSIS — L821 Other seborrheic keratosis: Secondary | ICD-10-CM | POA: Diagnosis not present

## 2018-09-25 DIAGNOSIS — L570 Actinic keratosis: Secondary | ICD-10-CM | POA: Diagnosis not present

## 2018-09-25 DIAGNOSIS — D1801 Hemangioma of skin and subcutaneous tissue: Secondary | ICD-10-CM | POA: Diagnosis not present

## 2018-09-25 DIAGNOSIS — L400 Psoriasis vulgaris: Secondary | ICD-10-CM | POA: Diagnosis not present

## 2018-09-28 DIAGNOSIS — M9905 Segmental and somatic dysfunction of pelvic region: Secondary | ICD-10-CM | POA: Diagnosis not present

## 2018-09-28 DIAGNOSIS — Q72812 Congenital shortening of left lower limb: Secondary | ICD-10-CM | POA: Diagnosis not present

## 2018-09-28 DIAGNOSIS — M9901 Segmental and somatic dysfunction of cervical region: Secondary | ICD-10-CM | POA: Diagnosis not present

## 2018-09-28 DIAGNOSIS — M4003 Postural kyphosis, cervicothoracic region: Secondary | ICD-10-CM | POA: Diagnosis not present

## 2018-10-01 DIAGNOSIS — M9901 Segmental and somatic dysfunction of cervical region: Secondary | ICD-10-CM | POA: Diagnosis not present

## 2018-10-01 DIAGNOSIS — Q72812 Congenital shortening of left lower limb: Secondary | ICD-10-CM | POA: Diagnosis not present

## 2018-10-01 DIAGNOSIS — M4003 Postural kyphosis, cervicothoracic region: Secondary | ICD-10-CM | POA: Diagnosis not present

## 2018-10-01 DIAGNOSIS — M9905 Segmental and somatic dysfunction of pelvic region: Secondary | ICD-10-CM | POA: Diagnosis not present

## 2018-10-15 DIAGNOSIS — E785 Hyperlipidemia, unspecified: Secondary | ICD-10-CM | POA: Diagnosis not present

## 2018-10-15 DIAGNOSIS — E559 Vitamin D deficiency, unspecified: Secondary | ICD-10-CM | POA: Diagnosis not present

## 2018-10-15 DIAGNOSIS — I10 Essential (primary) hypertension: Secondary | ICD-10-CM | POA: Diagnosis not present

## 2018-10-15 DIAGNOSIS — E7800 Pure hypercholesterolemia, unspecified: Secondary | ICD-10-CM | POA: Diagnosis not present

## 2018-10-15 DIAGNOSIS — L03114 Cellulitis of left upper limb: Secondary | ICD-10-CM | POA: Diagnosis not present

## 2019-01-12 ENCOUNTER — Other Ambulatory Visit: Payer: Self-pay

## 2019-01-12 DIAGNOSIS — Z20822 Contact with and (suspected) exposure to covid-19: Secondary | ICD-10-CM

## 2019-01-12 DIAGNOSIS — J029 Acute pharyngitis, unspecified: Secondary | ICD-10-CM | POA: Diagnosis not present

## 2019-01-12 DIAGNOSIS — R509 Fever, unspecified: Secondary | ICD-10-CM | POA: Diagnosis not present

## 2019-01-13 LAB — NOVEL CORONAVIRUS, NAA: SARS-CoV-2, NAA: NOT DETECTED

## 2019-01-29 ENCOUNTER — Other Ambulatory Visit: Payer: Self-pay

## 2019-01-29 DIAGNOSIS — Z20822 Contact with and (suspected) exposure to covid-19: Secondary | ICD-10-CM

## 2019-02-01 LAB — NOVEL CORONAVIRUS, NAA: SARS-CoV-2, NAA: NOT DETECTED

## 2019-02-11 DIAGNOSIS — M19049 Primary osteoarthritis, unspecified hand: Secondary | ICD-10-CM | POA: Diagnosis not present

## 2019-02-11 DIAGNOSIS — M1811 Unilateral primary osteoarthritis of first carpometacarpal joint, right hand: Secondary | ICD-10-CM | POA: Diagnosis not present

## 2019-06-15 ENCOUNTER — Other Ambulatory Visit: Payer: Self-pay | Admitting: Family Medicine

## 2019-06-15 DIAGNOSIS — Z1231 Encounter for screening mammogram for malignant neoplasm of breast: Secondary | ICD-10-CM

## 2019-07-08 DIAGNOSIS — H903 Sensorineural hearing loss, bilateral: Secondary | ICD-10-CM | POA: Diagnosis not present

## 2019-08-03 DIAGNOSIS — E669 Obesity, unspecified: Secondary | ICD-10-CM | POA: Diagnosis not present

## 2019-08-03 DIAGNOSIS — Z1211 Encounter for screening for malignant neoplasm of colon: Secondary | ICD-10-CM | POA: Diagnosis not present

## 2019-08-04 DIAGNOSIS — M25562 Pain in left knee: Secondary | ICD-10-CM | POA: Diagnosis not present

## 2019-08-06 DIAGNOSIS — E559 Vitamin D deficiency, unspecified: Secondary | ICD-10-CM | POA: Diagnosis not present

## 2019-08-06 DIAGNOSIS — M25569 Pain in unspecified knee: Secondary | ICD-10-CM | POA: Diagnosis not present

## 2019-08-06 DIAGNOSIS — Z0000 Encounter for general adult medical examination without abnormal findings: Secondary | ICD-10-CM | POA: Diagnosis not present

## 2019-08-06 DIAGNOSIS — M8588 Other specified disorders of bone density and structure, other site: Secondary | ICD-10-CM | POA: Diagnosis not present

## 2019-08-06 DIAGNOSIS — R69 Illness, unspecified: Secondary | ICD-10-CM | POA: Diagnosis not present

## 2019-08-06 DIAGNOSIS — E7800 Pure hypercholesterolemia, unspecified: Secondary | ICD-10-CM | POA: Diagnosis not present

## 2019-08-06 DIAGNOSIS — R030 Elevated blood-pressure reading, without diagnosis of hypertension: Secondary | ICD-10-CM | POA: Diagnosis not present

## 2019-08-10 ENCOUNTER — Other Ambulatory Visit: Payer: Self-pay | Admitting: Family Medicine

## 2019-08-10 DIAGNOSIS — M8580 Other specified disorders of bone density and structure, unspecified site: Secondary | ICD-10-CM

## 2019-08-16 DIAGNOSIS — D124 Benign neoplasm of descending colon: Secondary | ICD-10-CM | POA: Diagnosis not present

## 2019-08-16 DIAGNOSIS — K5730 Diverticulosis of large intestine without perforation or abscess without bleeding: Secondary | ICD-10-CM | POA: Diagnosis not present

## 2019-08-16 DIAGNOSIS — Z1211 Encounter for screening for malignant neoplasm of colon: Secondary | ICD-10-CM | POA: Diagnosis not present

## 2019-08-16 DIAGNOSIS — K635 Polyp of colon: Secondary | ICD-10-CM | POA: Diagnosis not present

## 2019-09-23 DIAGNOSIS — R509 Fever, unspecified: Secondary | ICD-10-CM | POA: Diagnosis not present

## 2019-09-23 DIAGNOSIS — R05 Cough: Secondary | ICD-10-CM | POA: Diagnosis not present

## 2019-09-23 DIAGNOSIS — R0602 Shortness of breath: Secondary | ICD-10-CM | POA: Diagnosis not present

## 2019-09-24 DIAGNOSIS — R05 Cough: Secondary | ICD-10-CM | POA: Diagnosis not present

## 2019-09-24 DIAGNOSIS — R509 Fever, unspecified: Secondary | ICD-10-CM | POA: Diagnosis not present

## 2019-09-24 DIAGNOSIS — R0602 Shortness of breath: Secondary | ICD-10-CM | POA: Diagnosis not present

## 2019-10-01 DIAGNOSIS — R69 Illness, unspecified: Secondary | ICD-10-CM | POA: Diagnosis not present

## 2019-10-05 DIAGNOSIS — D2261 Melanocytic nevi of right upper limb, including shoulder: Secondary | ICD-10-CM | POA: Diagnosis not present

## 2019-10-05 DIAGNOSIS — L821 Other seborrheic keratosis: Secondary | ICD-10-CM | POA: Diagnosis not present

## 2019-10-05 DIAGNOSIS — L814 Other melanin hyperpigmentation: Secondary | ICD-10-CM | POA: Diagnosis not present

## 2019-10-05 DIAGNOSIS — D2262 Melanocytic nevi of left upper limb, including shoulder: Secondary | ICD-10-CM | POA: Diagnosis not present

## 2019-10-05 DIAGNOSIS — L720 Epidermal cyst: Secondary | ICD-10-CM | POA: Diagnosis not present

## 2019-10-05 DIAGNOSIS — D225 Melanocytic nevi of trunk: Secondary | ICD-10-CM | POA: Diagnosis not present

## 2019-10-05 DIAGNOSIS — D1801 Hemangioma of skin and subcutaneous tissue: Secondary | ICD-10-CM | POA: Diagnosis not present

## 2019-10-19 DIAGNOSIS — R69 Illness, unspecified: Secondary | ICD-10-CM | POA: Diagnosis not present

## 2019-10-21 DIAGNOSIS — Z20822 Contact with and (suspected) exposure to covid-19: Secondary | ICD-10-CM | POA: Diagnosis not present

## 2019-10-26 ENCOUNTER — Other Ambulatory Visit: Payer: Self-pay

## 2019-10-26 ENCOUNTER — Ambulatory Visit: Payer: Medicare HMO

## 2019-10-26 ENCOUNTER — Ambulatory Visit
Admission: RE | Admit: 2019-10-26 | Discharge: 2019-10-26 | Disposition: A | Discharge status: Home / Self Care | Payer: Medicare HMO | Source: Ambulatory Visit | Attending: Family Medicine | Admitting: Family Medicine

## 2019-10-26 DIAGNOSIS — Z1231 Encounter for screening mammogram for malignant neoplasm of breast: Secondary | ICD-10-CM | POA: Diagnosis not present

## 2019-10-26 DIAGNOSIS — M8580 Other specified disorders of bone density and structure, unspecified site: Secondary | ICD-10-CM

## 2019-10-26 DIAGNOSIS — Z780 Asymptomatic menopausal state: Secondary | ICD-10-CM | POA: Diagnosis not present

## 2019-10-26 DIAGNOSIS — M85852 Other specified disorders of bone density and structure, left thigh: Secondary | ICD-10-CM | POA: Diagnosis not present

## 2020-01-12 DIAGNOSIS — H4313 Vitreous hemorrhage, bilateral: Secondary | ICD-10-CM | POA: Diagnosis not present

## 2020-01-12 DIAGNOSIS — H04123 Dry eye syndrome of bilateral lacrimal glands: Secondary | ICD-10-CM | POA: Diagnosis not present

## 2020-01-12 DIAGNOSIS — H524 Presbyopia: Secondary | ICD-10-CM | POA: Diagnosis not present

## 2020-01-12 DIAGNOSIS — H52223 Regular astigmatism, bilateral: Secondary | ICD-10-CM | POA: Diagnosis not present

## 2020-01-12 DIAGNOSIS — H5203 Hypermetropia, bilateral: Secondary | ICD-10-CM | POA: Diagnosis not present

## 2020-02-27 DIAGNOSIS — Z20822 Contact with and (suspected) exposure to covid-19: Secondary | ICD-10-CM | POA: Diagnosis not present

## 2020-05-29 DIAGNOSIS — M19071 Primary osteoarthritis, right ankle and foot: Secondary | ICD-10-CM | POA: Diagnosis not present

## 2020-05-29 DIAGNOSIS — M25571 Pain in right ankle and joints of right foot: Secondary | ICD-10-CM | POA: Diagnosis not present

## 2020-06-15 DIAGNOSIS — R030 Elevated blood-pressure reading, without diagnosis of hypertension: Secondary | ICD-10-CM | POA: Diagnosis not present

## 2020-06-15 DIAGNOSIS — R1013 Epigastric pain: Secondary | ICD-10-CM | POA: Diagnosis not present

## 2020-06-28 DIAGNOSIS — Z20822 Contact with and (suspected) exposure to covid-19: Secondary | ICD-10-CM | POA: Diagnosis not present

## 2020-07-15 ENCOUNTER — Other Ambulatory Visit: Payer: Self-pay

## 2020-07-15 ENCOUNTER — Emergency Department (HOSPITAL_BASED_OUTPATIENT_CLINIC_OR_DEPARTMENT_OTHER)
Admission: EM | Admit: 2020-07-15 | Discharge: 2020-07-15 | Disposition: A | Discharge status: Home / Self Care | Payer: Medicare HMO | Attending: Emergency Medicine | Admitting: Emergency Medicine

## 2020-07-15 ENCOUNTER — Encounter (HOSPITAL_BASED_OUTPATIENT_CLINIC_OR_DEPARTMENT_OTHER): Payer: Self-pay

## 2020-07-15 ENCOUNTER — Emergency Department (HOSPITAL_BASED_OUTPATIENT_CLINIC_OR_DEPARTMENT_OTHER): Payer: Medicare HMO

## 2020-07-15 DIAGNOSIS — Z8619 Personal history of other infectious and parasitic diseases: Secondary | ICD-10-CM | POA: Insufficient documentation

## 2020-07-15 DIAGNOSIS — Z20822 Contact with and (suspected) exposure to covid-19: Secondary | ICD-10-CM | POA: Diagnosis not present

## 2020-07-15 DIAGNOSIS — K5732 Diverticulitis of large intestine without perforation or abscess without bleeding: Secondary | ICD-10-CM | POA: Diagnosis not present

## 2020-07-15 DIAGNOSIS — E860 Dehydration: Secondary | ICD-10-CM | POA: Diagnosis not present

## 2020-07-15 DIAGNOSIS — Z8711 Personal history of peptic ulcer disease: Secondary | ICD-10-CM | POA: Diagnosis not present

## 2020-07-15 DIAGNOSIS — Z87891 Personal history of nicotine dependence: Secondary | ICD-10-CM | POA: Diagnosis not present

## 2020-07-15 DIAGNOSIS — K5792 Diverticulitis of intestine, part unspecified, without perforation or abscess without bleeding: Secondary | ICD-10-CM | POA: Diagnosis not present

## 2020-07-15 DIAGNOSIS — K921 Melena: Secondary | ICD-10-CM | POA: Diagnosis not present

## 2020-07-15 DIAGNOSIS — R1013 Epigastric pain: Secondary | ICD-10-CM | POA: Diagnosis present

## 2020-07-15 LAB — COMPREHENSIVE METABOLIC PANEL WITH GFR
ALT: 20 U/L (ref 0–44)
AST: 16 U/L (ref 15–41)
Albumin: 3.8 g/dL (ref 3.5–5.0)
Alkaline Phosphatase: 88 U/L (ref 38–126)
Anion gap: 10 (ref 5–15)
BUN: 11 mg/dL (ref 8–23)
CO2: 25 mmol/L (ref 22–32)
Calcium: 8.9 mg/dL (ref 8.9–10.3)
Chloride: 102 mmol/L (ref 98–111)
Creatinine, Ser: 0.81 mg/dL (ref 0.44–1.00)
GFR, Estimated: >60 mL/min
Glucose, Bld: 116 mg/dL — ABNORMAL HIGH (ref 70–99)
Potassium: 3.7 mmol/L (ref 3.5–5.1)
Sodium: 137 mmol/L (ref 135–145)
Total Bilirubin: 1 mg/dL (ref 0.3–1.2)
Total Protein: 7.1 g/dL (ref 6.5–8.1)

## 2020-07-15 LAB — CBC WITH DIFFERENTIAL/PLATELET
Abs Immature Granulocytes: 0.04 10*3/uL (ref 0.00–0.07)
Basophils Absolute: 0.1 10*3/uL (ref 0.0–0.1)
Basophils Relative: 0 %
Eosinophils Absolute: 0.1 10*3/uL (ref 0.0–0.5)
Eosinophils Relative: 1 %
HCT: 42.7 % (ref 36.0–46.0)
Hemoglobin: 14.2 g/dL (ref 12.0–15.0)
Immature Granulocytes: 0 %
Lymphocytes Relative: 14 %
Lymphs Abs: 1.8 10*3/uL (ref 0.7–4.0)
MCH: 31.8 pg (ref 26.0–34.0)
MCHC: 33.3 g/dL (ref 30.0–36.0)
MCV: 95.5 fL (ref 80.0–100.0)
Monocytes Absolute: 1.4 10*3/uL — ABNORMAL HIGH (ref 0.1–1.0)
Monocytes Relative: 11 %
Neutro Abs: 9.6 10*3/uL — ABNORMAL HIGH (ref 1.7–7.7)
Neutrophils Relative %: 74 %
Platelets: 262 10*3/uL (ref 150–400)
RBC: 4.47 MIL/uL (ref 3.87–5.11)
RDW: 12.7 % (ref 11.5–15.5)
WBC: 13 10*3/uL — ABNORMAL HIGH (ref 4.0–10.5)
nRBC: 0 % (ref 0.0–0.2)

## 2020-07-15 LAB — URINALYSIS, ROUTINE W REFLEX MICROSCOPIC
Bilirubin Urine: NEGATIVE
Glucose, UA: NEGATIVE mg/dL
Hgb urine dipstick: NEGATIVE
Ketones, ur: NEGATIVE mg/dL
Nitrite: NEGATIVE
Protein, ur: NEGATIVE mg/dL
Specific Gravity, Urine: 1.009 (ref 1.005–1.030)
pH: 7 (ref 5.0–8.0)

## 2020-07-15 LAB — LIPASE, BLOOD: Lipase: 12 U/L (ref 11–51)

## 2020-07-15 LAB — RESP PANEL BY RT-PCR (FLU A&B, COVID) ARPGX2
Influenza A by PCR: NEGATIVE
Influenza B by PCR: NEGATIVE
SARS Coronavirus 2 by RT PCR: NEGATIVE

## 2020-07-15 MED ORDER — AMOXICILLIN-POT CLAVULANATE 875-125 MG PO TABS
1.0000 | ORAL_TABLET | Freq: Once | ORAL | Status: AC
  Administered 2020-07-15: 1 via ORAL
  Filled 2020-07-15: qty 1

## 2020-07-15 MED ORDER — ONDANSETRON HCL 4 MG/2ML IJ SOLN
4.0000 mg | Freq: Once | INTRAMUSCULAR | Status: AC
  Administered 2020-07-15: 4 mg via INTRAVENOUS
  Filled 2020-07-15: qty 2

## 2020-07-15 MED ORDER — FENTANYL CITRATE (PF) 100 MCG/2ML IJ SOLN
50.0000 ug | Freq: Once | INTRAMUSCULAR | Status: AC
  Administered 2020-07-15: 50 ug via INTRAVENOUS
  Filled 2020-07-15: qty 2

## 2020-07-15 MED ORDER — ACETAMINOPHEN 325 MG PO TABS
650.0000 mg | ORAL_TABLET | Freq: Once | ORAL | Status: AC
  Administered 2020-07-15: 650 mg via ORAL
  Filled 2020-07-15: qty 2

## 2020-07-15 MED ORDER — IOHEXOL 300 MG/ML  SOLN
100.0000 mL | Freq: Once | INTRAMUSCULAR | Status: AC | PRN
  Administered 2020-07-15: 100 mL via INTRAVENOUS

## 2020-07-15 MED ORDER — AMOXICILLIN-POT CLAVULANATE 875-125 MG PO TABS: 1.0000 | ORAL_TABLET | Freq: Two times a day (BID) | ORAL | 0 refills | Status: DC

## 2020-07-15 MED ORDER — SODIUM CHLORIDE 0.9 % IV BOLUS
1000.0000 mL | Freq: Once | INTRAVENOUS | Status: AC
  Administered 2020-07-15: 1000 mL via INTRAVENOUS

## 2020-07-15 NOTE — Discharge Instructions (Signed)
You have diverticulitis so please take Augmentin twice daily for a week  We sent off H. pylori test that will take several days to come back.  Please have your doctor follow-up with it.   He will need to see GI for follow up.  If you have persistent pain in your stomach you will need an endoscopy to look for stomach ulcers.  There are no obvious ulcers on your CT scan today.  Return to ER if you have severe abdominal pain, vomiting, persistent fevers

## 2020-07-15 NOTE — ED Provider Notes (Signed)
[facility] EMERGENCY DEPT Provider Note   CSN: [id number] Arrival date & time: 07/15/20  1842     History Chief Complaint  Patient presents with  . Abdominal Pain    Andrea Aguirre is a 70 y.o. female hx of fibromyalgia, chronic fatigue syndrome, high cholesterol, here presenting with abdominal pain and fever.  She has normal pain for the last 3 weeks or so.  She went to see her primary care doctor and was concerned that she may have a stomach ulcer.  Patient was told to take some Pepcid.  She did take several doses of Pepcid and went to [place] for vacation.  She came back about a week ago and the abdominal pain got worse.  She states that while she was traveling, she has some dark stools and abdominal cramps. She states that for the last 2 days she started running a fever and her cramps got worse.  She also has a change in her stool consistency as well.  She saw Dr. Collene Mares previously for her stomach ulcer.  She states that she had H. pylori in the past.   The history is provided by the patient.       Past Medical History:  Diagnosis Date  . Anxiety    "just when I fly" (12/28/2014)  . Chronic fatigue syndrome    "hx; did natural medicine; been cleared up since Jun 24, 2008"  . Depression    "took antidepressant when my mom died in 06/25/1995 for 60-90 days; not on RX since" (12/28/2014)  . Elevated LFTs   . Fibromyalgia    "hx; did natural medicine; been cleared up since 06/24/08"  . Hypercholesterolemia   . Osteoarthritis    "maybe a little for my age" (12/28/2014)  . Pneumonia    "probably 3 times in the last 30 years" (12/28/2014)    Patient Active Problem List   Diagnosis Date Noted  . Plantar fasciitis, bilateral 10/30/2017  . Tendonitis, Achilles, right 10/30/2017  . Bilateral hearing loss 05/30/2015  . Incarcerated umbilical hernia 34/74/2595  . Hyperlipidemia 05/10/2013    Past Surgical History:  Procedure Laterality Date  . HERNIA REPAIR    . TONSILLECTOMY   1965  . UMBILICAL HERNIA REPAIR  12/28/2014   HERNIA REPAIR UMBILICAL ADULT/INCARCERATED  . UMBILICAL HERNIA REPAIR N/A 12/28/2014   Procedure: HERNIA REPAIR UMBILICAL ADULT/INCARCERATED;  Surgeon: Coralie Keens, MD;  Location: [facility];  Service: General;  Laterality: N/A;  . VAGINAL HYSTERECTOMY  ~ 1985     OB History   No obstetric history on file.     Family History  Problem Relation Age of Onset  . Lung cancer Mother     Social History   Tobacco Use  . Smoking status: Former Smoker    Years: 0.50    Types: Cigarettes  . Smokeless tobacco: Never Used  . Tobacco comment: "quit smoking in the late 70's/early 80's  Vaping Use  . Vaping Use: Never used  Substance Use Topics  . Alcohol use: Yes    Comment: 12/28/2014 "I have a mixed drink a few times/year"  . Drug use: Not Currently    Types: Marijuana    Comment: "quit in the early 70's/early 80's"    Home Medications Prior to Admission medications   Medication Sig Start Date End Date Taking? Authorizing Provider  meloxicam (MOBIC) 7.5 MG tablet Take 1 tablet (7.5 mg total) by mouth daily. 10/30/17   Trula Slade, DPM    Allergies  Codeine and Valacyclovir hcl  Review of Systems   Review of Systems  Gastrointestinal: Positive for abdominal pain.  All other systems reviewed and are negative.   Physical Exam Updated Vital Signs BP (!) 145/66 (BP Location: Right Arm)   Pulse 81   Temp 99.1 F (37.3 C) (Oral)   Resp 18   Ht 5\' 1"  (1.549 m)   Wt 76.2 kg   SpO2 94%   BMI 31.74 kg/m   Physical Exam Vitals and nursing note reviewed.  Constitutional:      Comments: Uncomfortable, slightly dehydrated  HENT:     Head: Normocephalic.     Mouth/Throat:     Pharynx: Oropharynx is clear.  Eyes:     Extraocular Movements: Extraocular movements intact.     Pupils: Pupils are equal, round, and reactive to light.  Cardiovascular:     Rate and Rhythm: Normal rate and regular rhythm.  Abdominal:      General: Abdomen is flat.     Comments: Mild epigastric tenderness and diffuse lower abdominal tenderness  Skin:    General: Skin is warm.     Capillary Refill: Capillary refill takes less than 2 seconds.  Neurological:     General: No focal deficit present.     Mental Status: She is oriented to person, place, and time.  Psychiatric:        Mood and Affect: Mood normal.     ED Results / Procedures / Treatments   Labs (all labs ordered are listed, but only abnormal results are displayed) Labs Reviewed  CBC WITH DIFFERENTIAL/PLATELET - Abnormal; Notable for the following components:      Result Value   WBC 13.0 (*)    Neutro Abs 9.6 (*)    Monocytes Absolute 1.4 (*)    All other components within normal limits  COMPREHENSIVE METABOLIC PANEL - Abnormal; Notable for the following components:   Glucose, Bld 116 (*)    All other components within normal limits  URINALYSIS, ROUTINE W REFLEX MICROSCOPIC - Abnormal; Notable for the following components:   Leukocytes,Ua TRACE (*)    Bacteria, UA RARE (*)    All other components within normal limits  RESP PANEL BY RT-PCR (FLU A&B, COVID) ARPGX2  LIPASE, BLOOD  H. PYLORI ANTIBODY, IGG    EKG None  Radiology CT ABDOMEN PELVIS W CONTRAST  Result Date: 07/15/2020 CLINICAL DATA:  70 year old female with abdominal distension. EXAM: CT ABDOMEN AND PELVIS WITH CONTRAST TECHNIQUE: Multidetector CT imaging of the abdomen and pelvis was performed using the standard protocol following bolus administration of intravenous contrast. CONTRAST:  168mL OMNIPAQUE IOHEXOL 300 MG/ML  SOLN COMPARISON:  None. FINDINGS: Lower chest: The visualized lung bases are clear. No intra-abdominal free air or free fluid. Hepatobiliary: No focal liver abnormality is seen. No gallstones, gallbladder wall thickening, or biliary dilatation. Pancreas: Unremarkable. No pancreatic ductal dilatation or surrounding inflammatory changes. Spleen: Normal in size without focal  abnormality. Adrenals/Urinary Tract: The adrenal glands unremarkable. The kidneys, visualized ureters, and urinary bladder appear unremarkable. Stomach/Bowel: There is sigmoid diverticulosis with active inflammatory changes consistent with acute diverticulitis. No diverticular abscess or perforation. Moderate stool throughout the colon. There is no bowel obstruction. The appendix is normal. Vascular/Lymphatic: Mild aortoiliac atherosclerotic disease. The IVC is unremarkable. No portal venous gas. There is no adenopathy. Reproductive: Hysterectomy.  No adnexal masses. Other: None Musculoskeletal: Degenerative changes of the spine. No acute osseous pathology. IMPRESSION: 1. Sigmoid diverticulitis. No diverticular abscess or perforation. 2. Aortic Atherosclerosis (ICD10-I70.0). Electronically  Signed   By: Anner Crete M.D.   On: 07/15/2020 21:23    Procedures Procedures   Medications Ordered in ED Medications  amoxicillin-clavulanate (AUGMENTIN) 875-125 MG per tablet 1 tablet (has no administration in time range)  sodium chloride 0.9 % bolus 1,000 mL (0 mLs Intravenous Stopped 07/15/20 2026)  fentaNYL (SUBLIMAZE) injection 50 mcg (50 mcg Intravenous Given 07/15/20 1931)  ondansetron (ZOFRAN) injection 4 mg (4 mg Intravenous Given 07/15/20 1930)  acetaminophen (TYLENOL) tablet 650 mg (650 mg Oral Given 07/15/20 1930)  iohexol (OMNIPAQUE) 300 MG/ML solution 100 mL (100 mLs Intravenous Contrast Given 07/15/20 2054)    ED Course  I have reviewed the triage vital signs and the nursing notes.  Pertinent labs & imaging results that were available during my care of the patient were reviewed by me and considered in my medical decision making (see chart for details).    MDM Rules/Calculators/A&P                         FLORIDA NOLTON is a 70 y.o. female here presenting with abdominal pain and fever.  Patient did recently travel to [place].  Consider viral gastroenteritis versus COVID versus colitis versus  stomach ulcer.  Plan to get CBC and CMP and lipase and CT abdomen pelvis and UA and COVID test.  Will hydrate and reassess  9:32 PM Labs show white blood cell count of 13.  Her COVID test is negative.  Her CT showed diverticulitis with no perforation. I think her abdominal pain and fever is likely from diverticulitis.  We will discharge home with a course of Augmentin.  We will have her follow-up with GI  Final Clinical Impression(s) / ED Diagnoses Final diagnoses:  None    Rx / DC Orders ED Discharge Orders    None       Drenda Freeze, MD 07/15/20 2134

## 2020-07-15 NOTE — ED Triage Notes (Signed)
Pt with abd pain x 3 weeks and PCP started her on Pepcid which provided minimal relief. Pt went to [place] and came home a week ago. Past couple of days pt's abd pain became worse, pt having fever with TMax of 100.7, and a change in stool consistency.

## 2020-07-16 ENCOUNTER — Telehealth: Payer: Self-pay

## 2020-07-16 NOTE — Telephone Encounter (Signed)
Patient called to state that she received no prescription or paperwork from visit. Called [facility], they were closed for lunch until 2p, and called augmentin prescription in. . Patient aware of lunch delay

## 2020-07-17 LAB — H. PYLORI ANTIBODY, IGG: H Pylori IgG: 6.73 {index_val} — ABNORMAL HIGH (ref 0.00–0.79)

## 2020-07-21 ENCOUNTER — Telehealth: Payer: Self-pay

## 2020-07-21 NOTE — Telephone Encounter (Signed)
Left message to call back. We received GI records from Dr. Lorie Apley office. Need to know if pt wants to transfer care. Records are in referral folder.

## 2020-08-01 ENCOUNTER — Telehealth: Payer: Self-pay | Admitting: Gastroenterology

## 2020-08-01 DIAGNOSIS — E785 Hyperlipidemia, unspecified: Secondary | ICD-10-CM | POA: Diagnosis not present

## 2020-08-01 DIAGNOSIS — M8580 Other specified disorders of bone density and structure, unspecified site: Secondary | ICD-10-CM | POA: Diagnosis not present

## 2020-08-01 DIAGNOSIS — I700 Atherosclerosis of aorta: Secondary | ICD-10-CM | POA: Diagnosis not present

## 2020-08-01 DIAGNOSIS — Z8711 Personal history of peptic ulcer disease: Secondary | ICD-10-CM | POA: Diagnosis not present

## 2020-08-01 DIAGNOSIS — Z1331 Encounter for screening for depression: Secondary | ICD-10-CM | POA: Diagnosis not present

## 2020-08-01 DIAGNOSIS — K5792 Diverticulitis of intestine, part unspecified, without perforation or abscess without bleeding: Secondary | ICD-10-CM | POA: Diagnosis not present

## 2020-08-01 NOTE — Telephone Encounter (Signed)
I will review the report and any pathology when I return to clinic in the next couple of days. Thanks. GM

## 2020-08-01 NOTE — Telephone Encounter (Signed)
Good afternoon Dr. Rush Landmark,  We have received patient's records from her last colonoscopy on 08/16/2019 at [facility]; Dr. Juanita Craver. Patient states she participated in a clinical research study and was assigned to Dr. Collene Mares.  Patient would like to have repeat colon with our practice. The patient is requesting you for her GI medical treatments/provider.   I will send you her records for review and please advise on scheduling.   Thank you.

## 2020-08-03 NOTE — Telephone Encounter (Addendum)
I have reviewed the patient's records.  Patient evaluated for colorectal cancer screening in May 2021.  She was also dealing with some reflux issues.  June 2021 colonoscopy Multiple small and large mouth diverticula found in the sigmoid colon. Small sessile polyp was found in the mid descending colon that was removed by cold biopsy. The terminal ileum appeared normal. No additional abnormalities were found on retroflexion. Pathology consistent with tubular adenoma. Patient was not recommended to have further follow-up as a result of her age at this time what her follow-up would be (a 7-year follow-up).  Looks like in May 2022 she had a CT scan that has shown evidence of diverticulitis and may be getting treated with antibiotics though is not clear to me patient's PCP is not part of abdomen.  She would not have any indication to have a repeat colonoscopy for at least 6 to 8 weeks post diverticulitis treatment and with having had a recent full colonoscopy with good preparation within the last year not sure repeat colonoscopy is recommended or needed at this time.  She should be set up for clinic with myself or one of the APP's to better understand why she would like another colonoscopy work to see if she has other symptoms post her diverticulitis attack/flareup.  She can be placed for a recall for 2028 for colon cancer screening and colon polyp surveillance based on the findings of her most recent colonoscopy if she desires to follow-up in our clinic in.  These notes will be scanned into the chart.  Justice Britain, MD [facility] Office # [phone]

## 2020-08-03 NOTE — Telephone Encounter (Signed)
The pt has been scheduled for 09/12/20 at 210 pm.  Recall entered Left message on machine to call back

## 2020-08-04 NOTE — Telephone Encounter (Signed)
Left message on machine to call back  

## 2020-08-08 NOTE — Telephone Encounter (Signed)
Left message on machine to call back  

## 2020-08-09 NOTE — Telephone Encounter (Signed)
The pt has been advised of the appt for follow up with Dr Rush Landmark.

## 2020-08-09 NOTE — Telephone Encounter (Signed)
Left message on machine to call back  

## 2020-08-24 ENCOUNTER — Encounter: Payer: Medicare HMO | Admitting: Internal Medicine

## 2020-09-12 ENCOUNTER — Ambulatory Visit (INDEPENDENT_AMBULATORY_CARE_PROVIDER_SITE_OTHER): Payer: Medicare HMO | Admitting: Gastroenterology

## 2020-09-12 ENCOUNTER — Encounter: Payer: Self-pay | Admitting: *Deleted

## 2020-09-12 ENCOUNTER — Encounter: Payer: Self-pay | Admitting: Gastroenterology

## 2020-09-12 ENCOUNTER — Other Ambulatory Visit: Payer: Medicare HMO

## 2020-09-12 VITALS — BP 128/62 | HR 76 | Ht 61.0 in | Wt 162.6 lb

## 2020-09-12 DIAGNOSIS — Z8711 Personal history of peptic ulcer disease: Secondary | ICD-10-CM

## 2020-09-12 DIAGNOSIS — K5790 Diverticulosis of intestine, part unspecified, without perforation or abscess without bleeding: Secondary | ICD-10-CM | POA: Diagnosis not present

## 2020-09-12 DIAGNOSIS — Z8719 Personal history of other diseases of the digestive system: Secondary | ICD-10-CM

## 2020-09-12 DIAGNOSIS — Z86010 Personal history of colonic polyps: Secondary | ICD-10-CM | POA: Diagnosis not present

## 2020-09-12 DIAGNOSIS — Z860101 Personal history of adenomatous and serrated colon polyps: Secondary | ICD-10-CM

## 2020-09-12 NOTE — Patient Instructions (Addendum)
If you are age 70 or older, your body mass index should be between 23-30. Your Body mass index is 30.72 kg/m. If this is out of the aforementioned range listed, please consider follow up with your Primary Care Provider.  If you are age 49 or younger, your body mass index should be between 19-25. Your Body mass index is 30.72 kg/m. If this is out of the aformentioned range listed, please consider follow up with your Primary Care Provider.   __________________________________________________________  The [facility] providers would like to encourage you to use [facility] to communicate with providers for non-urgent requests or questions.  Due to long hold times on the telephone, sending your provider a message by [facility] may be a faster and more efficient way to get a response.  Please allow 48 business hours for a response.  Please remember that this is for non-urgent requests.   Your provider has requested that you go to the basement level for lab work before leaving today. Press "B" on the elevator. The lab is located at the first door on the left as you exit the elevator.  A high fiber diet with plenty of fluids (up to 8 glasses of water daily) is suggested to relieve these symptoms.  Metamucil, 1 tablespoon once or twice daily can be used to keep bowels regular if needed.  Consider taking Fiber Con over the counter 1-2 times daily as tolerated.   You will be due for a recall colonoscopy in 5/1/ 2028. We will send you a reminder in the mail when it gets closer to that time.  Thank you for choosing me and [facility].  Dr. Rush Landmark

## 2020-09-12 NOTE — Progress Notes (Signed)
[facility] [place] VISIT   Primary Care Provider Angelica Pou, MD [address] [place] [address] [phone]  Referring Provider Angelica Pou, MD [address] [place],  [place] [address] [phone]  Patient Profile: Andrea Aguirre is a 70 y.o. female with a pmh significant for arthritis, ?PUD and possible HP infection, diverticulosis, recent diverticulitis, colon polyps (TA).  The patient presents to the [facility] for an evaluation and management of problem(s) noted below:  Problem List 1. History of diverticulitis   2. Diverticulosis   3. History of adenomatous polyp of colon   4. History of peptic ulcer disease     History of Present Illness This is the patient's first visit to the outpatient [place] clinic.  She previously underwent a colonoscopy in 2021 with Dr. Collene Mares in a clinical trial setting.  She was paid to undergo this colonoscopy which was the first colonoscopy she had ever undergone.  She had personal social history of individuals who have done poorly after colonoscopies and so she had concerns about this until the clinical trial colonoscopy was performed.  She had a small tubular adenoma removed with a plan for a 7-year colonoscopy recall but based on the patient's age it was not clear if she was going to end up following through.  Patient presented in May 2021 with significant abdominal discomfort initially to her PCP.  She thought that this could be also related as she has had a previous history of ulcers and was told to consider PPI therapy although she did not.  She returned from a vacation and continued to have issues.  She went to the emergency department and was diagnosed with diverticulitis based on clinical diagnosis as well as imaging.  She had a GI referral placed after the finding of diverticulitis.  The patient was treated with antibiotics for a 1 week course.  She felt better within a few  days.  She is back to normal.  She has normal bowel movement on a regular basis.  No blood in her stools.  The does take NSAIDs on an infrequent basis.  She describes a prior history of peptic ulcer disease and diagnosed with H. pylori although this was based on blood test and never stool antigen testing or endoscopy.  GI Review of Systems Positive as above Negative for dysphagia, odynophagia, nausea, vomiting, pain, alteration of bowel habits, melena, hematochezia  Review of Systems General: Denies fevers/chills/weight loss unintentionally Cardiovascular: Denies chest pain Pulmonary: Denies shortness of breath Gastroenterological: See HPI Genitourinary: Denies darkened urine Hematological: Denies easy bruising/bleeding Endocrine: Denies temperature intolerance Dermatological: Denies jaundice Psychological: Mood is stable   Medications No current outpatient medications on file.   No current facility-administered medications for this visit.   Facility-Administered Medications Ordered in Other Visits  Medication Dose Route Frequency Provider Last Rate Last Admin   dextrose 5 % and 0.45 % NaCl with KCl 20 mEq/L infusion   Intravenous Continuous Georganna Skeans, MD       fentaNYL (SUBLIMAZE) injection 25 mcg  25 mcg Intravenous Q1H PRN Georganna Skeans, MD       ondansetron (ZOFRAN-ODT) disintegrating tablet 4 mg  4 mg Oral Q6H PRN Georganna Skeans, MD       Or   ondansetron [facility]) 4 mg in sodium chloride 0.9 % 50 mL IVPB  4 mg Intravenous Q6H PRN Georganna Skeans, MD        Allergies Allergies  Allergen Reactions   Codeine Itching  Valacyclovir Hcl Hives    Histories Past Medical History:  Diagnosis Date   Anxiety    "just when I fly" (12/28/2014)   Chronic fatigue syndrome    "hx; did natural medicine; been cleared up since June 12, 2008"   Depression    "took antidepressant when my mom died in Jun 13, 1995 for 60-90 days; not on RX since" (12/28/2014)   Diverticulitis     Diverticulosis    Elevated LFTs    Fibromyalgia    "hx; did natural medicine; been cleared up since 06/12/08"   Hypercholesterolemia    Osteoarthritis    "maybe a little for my age" (12/28/2014)   Pneumonia    "probably 3 times in the last 30 years" (12/28/2014)   Past Surgical History:  Procedure Laterality Date   TONSILLECTOMY  41/66/0630   UMBILICAL HERNIA REPAIR  12/28/2014   HERNIA REPAIR UMBILICAL ADULT/INCARCERATED   UMBILICAL HERNIA REPAIR N/A 12/28/2014   Procedure: HERNIA REPAIR UMBILICAL ADULT/INCARCERATED;  Surgeon: Coralie Keens, MD;  Location: [facility];  Service: General;  Laterality: N/A;   VAGINAL HYSTERECTOMY  ~ 1985   Social History   Socioeconomic History   Marital status: Married    Spouse name: Not on file   Number of children: Not on file   Years of education: Not on file   Highest education level: Not on file  Occupational History   Not on file  Tobacco Use   Smoking status: Former    Years: 0.50    Pack years: 0.00    Types: Cigarettes   Smokeless tobacco: Never   Tobacco comments:    "quit smoking in the late 70's/early 80's  Vaping Use   Vaping Use: Never used  Substance and Sexual Activity   Alcohol use: Yes    Comment: 12/28/2014 "I have a mixed drink a few times/year"   Drug use: Not Currently    Comment: "quit in the early 70's/early 80's--used marijuana"   Sexual activity: Yes  Other Topics Concern   Not on file  Social History Narrative   Not on file   Social Determinants of Health   Financial Resource Strain: Not on file  Food Insecurity: Not on file  Transportation Needs: Not on file  Physical Activity: Not on file  Stress: Not on file  Social Connections: Not on file  Intimate Partner Violence: Not on file   Family History  Problem Relation Age of Onset   Lung cancer Mother    Colon cancer Neg Hx    Esophageal cancer Neg Hx    Pancreatic cancer Neg Hx    Stomach cancer Neg Hx    I have reviewed her medical, social,  and family history in detail and updated the electronic medical record as necessary.    PHYSICAL EXAMINATION  BP 128/62   Pulse 76   Ht 5\' 1"  (1.549 m)   Wt 162 lb 9.6 oz (73.8 kg)   BMI 30.72 kg/m  Wt Readings from Last 3 Encounters:  09/12/20 162 lb 9.6 oz (73.8 kg)  07/15/20 168 lb (76.2 kg)  01/30/15 169 lb 6.4 oz (76.8 kg)  GEN: NAD, appears stated age, doesn't appear chronically ill PSYCH: Cooperative, without pressured speech EYE: Conjunctivae pink, sclerae anicteric ENT: Masked CV: Nontachycardic RESP: No audible wheezing GI: NABS, soft, protuberant abdomen, rounded, NT/ND, without rebound or guarding, no HSM appreciated MSK/EXT: No lower extremity edema SKIN: No jaundice NEURO:  Alert & Oriented x 3, no focal deficits   REVIEW OF DATA  I  reviewed the following data at the time of this encounter:  GI Procedures and Studies  June 2021 colonoscopy (outside provider Dr. Collene Mares) Multiple small and large mouth diverticula were found in the sigmoid colon. A small sessile polyp was found in the mid descending colon and removed by cold biopsy. The terminal ileum appeared normal. No additional abnormalities were found on retroflexion. Pathology Tubular adenoma  Laboratory Studies  Reviewed those in epic  Imaging Studies  May 2022 CT abdomen pelvis with contrast IMPRESSION: 1. Sigmoid diverticulitis. No diverticular abscess or perforation. 2. Aortic Atherosclerosis (ICD10-I70.0).   ASSESSMENT  Ms. Ovando is a 70 y.o. female with a pmh significant for arthritis, ?PUD and possible HP infection, diverticulosis, recent diverticulitis, colon polyps (TA).  The patient is seen today for evaluation and management of:  1. History of diverticulitis   2. Diverticulosis   3. History of adenomatous polyp of colon   4. History of peptic ulcer disease    The patient is hemodynamically and clinically stable.  She has had a recent bout of imaging/clinical diverticulitis which was  treated effectively with antibiotic therapy.  She does not seem to have any residual complications of this.  Since she just recently had a colonoscopy in 2021, unless the patient began to experience more significant episodes of diverticulitis she does not necessarily need a repeat colonoscopy for another 6 years (2028) when she would be due for colon polyp surveillance.  With this being said, we certainly can consider if other issues develop a repeat diagnostic colonoscopy.  The patient is in agreement with holding on this for now unless things change.  Interestingly, patient has a history of reported peptic ulcer disease with H. pylori although she never had a stool antigen test.  It looks like she was treated based on a positive serology.  I recommended H. pylori stool antigen testing to ensure that the reported H. pylori infection has been eradicated completely (even though it has been many years).  The patient will work on increasing fiber in her diet as well as fiber supplementation which are known factors that can decrease the risk of complicated diverticulitis in the future.  All patient questions were answered to the best of my ability, and the patient agrees to the aforementioned plan of action with follow-up as indicated.   PLAN  High-fiber diet recommended Fiber supplementation (Metamucil/Benefiber/FiberCon) recommended on a daily basis H. pylori stool antigen to be obtained Colonoscopy for surveillance in 2028 or earlier diagnostic if issues recur more often Holding on surgical referral since she has only had 1 bout of noncomplicated diverticulitis   Orders Placed This Encounter  Procedures   Helicobacter pylori special antigen    New Prescriptions   No medications on file   Modified Medications   No medications on file    Planned Follow Up No follow-ups on file.   Total Time in Face-to-Face and in Coordination of Care for patient including independent/personal  interpretation/review of prior testing, medical history, examination, medication adjustment, communicating results with the patient directly, and documentation with the EHR is 35 minutes.   Justice Britain, MD [facility] Office # [phone]

## 2020-09-13 DIAGNOSIS — M5137 Other intervertebral disc degeneration, lumbosacral region: Secondary | ICD-10-CM | POA: Diagnosis not present

## 2020-09-13 DIAGNOSIS — M50322 Other cervical disc degeneration at C5-C6 level: Secondary | ICD-10-CM | POA: Diagnosis not present

## 2020-09-13 DIAGNOSIS — M9901 Segmental and somatic dysfunction of cervical region: Secondary | ICD-10-CM | POA: Diagnosis not present

## 2020-09-13 DIAGNOSIS — M9903 Segmental and somatic dysfunction of lumbar region: Secondary | ICD-10-CM | POA: Diagnosis not present

## 2020-09-14 ENCOUNTER — Encounter: Payer: Medicare HMO | Admitting: Internal Medicine

## 2020-09-14 ENCOUNTER — Telehealth: Payer: Self-pay

## 2020-09-14 ENCOUNTER — Encounter: Payer: Self-pay | Admitting: Gastroenterology

## 2020-09-14 DIAGNOSIS — K5790 Diverticulosis of intestine, part unspecified, without perforation or abscess without bleeding: Secondary | ICD-10-CM | POA: Insufficient documentation

## 2020-09-14 DIAGNOSIS — Z8719 Personal history of other diseases of the digestive system: Secondary | ICD-10-CM | POA: Insufficient documentation

## 2020-09-14 DIAGNOSIS — Z86010 Personal history of colonic polyps: Secondary | ICD-10-CM | POA: Insufficient documentation

## 2020-09-14 DIAGNOSIS — Z860101 Personal history of adenomatous and serrated colon polyps: Secondary | ICD-10-CM | POA: Insufficient documentation

## 2020-09-14 DIAGNOSIS — Z8711 Personal history of peptic ulcer disease: Secondary | ICD-10-CM | POA: Insufficient documentation

## 2020-09-14 NOTE — Telephone Encounter (Signed)
Received TC from patient who is scheduled to see Dr. Jimmye Norman today as a new patient. 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  The client has tested positive for Covid today.  Pt states since she has been in such close contact with this client, she is going to cancel today's appointment d/t her exposure.    She would like to reschedule with Dr. Jimmye Norman.  RN unable to r/s new patient appt, but will forward to office manager.  Informed patient she will be called with new appt date, she was very appreciative. SChaplin, RN,BSN

## 2020-09-18 DIAGNOSIS — M9901 Segmental and somatic dysfunction of cervical region: Secondary | ICD-10-CM | POA: Diagnosis not present

## 2020-09-18 DIAGNOSIS — M9903 Segmental and somatic dysfunction of lumbar region: Secondary | ICD-10-CM | POA: Diagnosis not present

## 2020-09-18 DIAGNOSIS — M5137 Other intervertebral disc degeneration, lumbosacral region: Secondary | ICD-10-CM | POA: Diagnosis not present

## 2020-09-18 DIAGNOSIS — M50322 Other cervical disc degeneration at C5-C6 level: Secondary | ICD-10-CM | POA: Diagnosis not present

## 2020-10-04 DIAGNOSIS — L218 Other seborrheic dermatitis: Secondary | ICD-10-CM | POA: Diagnosis not present

## 2020-10-04 DIAGNOSIS — L720 Epidermal cyst: Secondary | ICD-10-CM | POA: Diagnosis not present

## 2020-10-04 DIAGNOSIS — D225 Melanocytic nevi of trunk: Secondary | ICD-10-CM | POA: Diagnosis not present

## 2020-10-04 DIAGNOSIS — L814 Other melanin hyperpigmentation: Secondary | ICD-10-CM | POA: Diagnosis not present

## 2020-10-04 DIAGNOSIS — D1801 Hemangioma of skin and subcutaneous tissue: Secondary | ICD-10-CM | POA: Diagnosis not present

## 2020-10-04 DIAGNOSIS — D2261 Melanocytic nevi of right upper limb, including shoulder: Secondary | ICD-10-CM | POA: Diagnosis not present

## 2020-10-04 DIAGNOSIS — L821 Other seborrheic keratosis: Secondary | ICD-10-CM | POA: Diagnosis not present

## 2020-10-04 DIAGNOSIS — D2262 Melanocytic nevi of left upper limb, including shoulder: Secondary | ICD-10-CM | POA: Diagnosis not present

## 2020-10-05 ENCOUNTER — Encounter: Payer: Medicare HMO | Admitting: Internal Medicine

## 2020-10-10 DIAGNOSIS — M7061 Trochanteric bursitis, right hip: Secondary | ICD-10-CM | POA: Diagnosis not present

## 2020-11-09 DIAGNOSIS — M6281 Muscle weakness (generalized): Secondary | ICD-10-CM | POA: Diagnosis not present

## 2020-11-09 DIAGNOSIS — M7061 Trochanteric bursitis, right hip: Secondary | ICD-10-CM | POA: Diagnosis not present

## 2021-01-15 DIAGNOSIS — L820 Inflamed seborrheic keratosis: Secondary | ICD-10-CM | POA: Diagnosis not present

## 2021-05-23 DIAGNOSIS — M4692 Unspecified inflammatory spondylopathy, cervical region: Secondary | ICD-10-CM | POA: Diagnosis not present

## 2021-05-23 DIAGNOSIS — M6281 Muscle weakness (generalized): Secondary | ICD-10-CM | POA: Diagnosis not present

## 2021-05-23 DIAGNOSIS — M62838 Other muscle spasm: Secondary | ICD-10-CM | POA: Diagnosis not present

## 2021-05-30 DIAGNOSIS — M62838 Other muscle spasm: Secondary | ICD-10-CM | POA: Diagnosis not present

## 2021-05-30 DIAGNOSIS — M6281 Muscle weakness (generalized): Secondary | ICD-10-CM | POA: Diagnosis not present

## 2021-06-01 DIAGNOSIS — H5203 Hypermetropia, bilateral: Secondary | ICD-10-CM | POA: Diagnosis not present

## 2021-06-06 DIAGNOSIS — M6281 Muscle weakness (generalized): Secondary | ICD-10-CM | POA: Diagnosis not present

## 2021-06-06 DIAGNOSIS — M62838 Other muscle spasm: Secondary | ICD-10-CM | POA: Diagnosis not present

## 2021-07-05 DIAGNOSIS — Z79899 Other long term (current) drug therapy: Secondary | ICD-10-CM | POA: Diagnosis not present

## 2021-07-05 DIAGNOSIS — E7800 Pure hypercholesterolemia, unspecified: Secondary | ICD-10-CM | POA: Diagnosis not present

## 2021-07-05 DIAGNOSIS — Z0000 Encounter for general adult medical examination without abnormal findings: Secondary | ICD-10-CM | POA: Diagnosis not present

## 2021-07-05 DIAGNOSIS — M797 Fibromyalgia: Secondary | ICD-10-CM | POA: Diagnosis not present

## 2021-07-05 DIAGNOSIS — M1990 Unspecified osteoarthritis, unspecified site: Secondary | ICD-10-CM | POA: Diagnosis not present

## 2021-07-05 DIAGNOSIS — E559 Vitamin D deficiency, unspecified: Secondary | ICD-10-CM | POA: Diagnosis not present

## 2021-07-05 DIAGNOSIS — R69 Illness, unspecified: Secondary | ICD-10-CM | POA: Diagnosis not present

## 2021-07-05 DIAGNOSIS — M6281 Muscle weakness (generalized): Secondary | ICD-10-CM | POA: Diagnosis not present

## 2021-07-05 DIAGNOSIS — M62838 Other muscle spasm: Secondary | ICD-10-CM | POA: Diagnosis not present

## 2021-07-05 DIAGNOSIS — M5090 Cervical disc disorder, unspecified, unspecified cervical region: Secondary | ICD-10-CM | POA: Diagnosis not present

## 2021-07-11 DIAGNOSIS — E559 Vitamin D deficiency, unspecified: Secondary | ICD-10-CM | POA: Diagnosis not present

## 2021-07-11 DIAGNOSIS — E7800 Pure hypercholesterolemia, unspecified: Secondary | ICD-10-CM | POA: Diagnosis not present

## 2021-07-30 DIAGNOSIS — R5383 Other fatigue: Secondary | ICD-10-CM | POA: Diagnosis not present

## 2021-07-30 DIAGNOSIS — F339 Major depressive disorder, recurrent, unspecified: Secondary | ICD-10-CM | POA: Diagnosis not present

## 2021-07-30 DIAGNOSIS — F419 Anxiety disorder, unspecified: Secondary | ICD-10-CM | POA: Diagnosis not present

## 2021-07-30 DIAGNOSIS — E559 Vitamin D deficiency, unspecified: Secondary | ICD-10-CM | POA: Diagnosis not present

## 2021-07-30 DIAGNOSIS — R69 Illness, unspecified: Secondary | ICD-10-CM | POA: Diagnosis not present

## 2021-10-04 DIAGNOSIS — R062 Wheezing: Secondary | ICD-10-CM | POA: Diagnosis not present

## 2021-10-04 DIAGNOSIS — E559 Vitamin D deficiency, unspecified: Secondary | ICD-10-CM | POA: Diagnosis not present

## 2021-10-04 DIAGNOSIS — R635 Abnormal weight gain: Secondary | ICD-10-CM | POA: Diagnosis not present

## 2021-10-04 DIAGNOSIS — E7800 Pure hypercholesterolemia, unspecified: Secondary | ICD-10-CM | POA: Diagnosis not present

## 2021-10-04 DIAGNOSIS — B372 Candidiasis of skin and nail: Secondary | ICD-10-CM | POA: Diagnosis not present

## 2021-10-04 DIAGNOSIS — G629 Polyneuropathy, unspecified: Secondary | ICD-10-CM | POA: Diagnosis not present

## 2021-10-04 DIAGNOSIS — M797 Fibromyalgia: Secondary | ICD-10-CM | POA: Diagnosis not present

## 2021-10-04 DIAGNOSIS — R946 Abnormal results of thyroid function studies: Secondary | ICD-10-CM | POA: Diagnosis not present

## 2021-10-05 DIAGNOSIS — G629 Polyneuropathy, unspecified: Secondary | ICD-10-CM | POA: Diagnosis not present

## 2021-10-05 DIAGNOSIS — R635 Abnormal weight gain: Secondary | ICD-10-CM | POA: Diagnosis not present

## 2021-10-05 DIAGNOSIS — E559 Vitamin D deficiency, unspecified: Secondary | ICD-10-CM | POA: Diagnosis not present

## 2021-10-05 DIAGNOSIS — R946 Abnormal results of thyroid function studies: Secondary | ICD-10-CM | POA: Diagnosis not present

## 2021-10-05 DIAGNOSIS — R7989 Other specified abnormal findings of blood chemistry: Secondary | ICD-10-CM | POA: Diagnosis not present

## 2021-10-16 DIAGNOSIS — D2262 Melanocytic nevi of left upper limb, including shoulder: Secondary | ICD-10-CM | POA: Diagnosis not present

## 2021-10-16 DIAGNOSIS — L821 Other seborrheic keratosis: Secondary | ICD-10-CM | POA: Diagnosis not present

## 2021-10-16 DIAGNOSIS — D1801 Hemangioma of skin and subcutaneous tissue: Secondary | ICD-10-CM | POA: Diagnosis not present

## 2021-10-16 DIAGNOSIS — L814 Other melanin hyperpigmentation: Secondary | ICD-10-CM | POA: Diagnosis not present

## 2021-10-16 DIAGNOSIS — D2222 Melanocytic nevi of left ear and external auricular canal: Secondary | ICD-10-CM | POA: Diagnosis not present

## 2021-10-16 DIAGNOSIS — D225 Melanocytic nevi of trunk: Secondary | ICD-10-CM | POA: Diagnosis not present

## 2021-10-17 DIAGNOSIS — R002 Palpitations: Secondary | ICD-10-CM | POA: Diagnosis not present

## 2021-10-17 DIAGNOSIS — I700 Atherosclerosis of aorta: Secondary | ICD-10-CM | POA: Diagnosis not present

## 2021-10-17 DIAGNOSIS — Z6831 Body mass index (BMI) 31.0-31.9, adult: Secondary | ICD-10-CM | POA: Diagnosis not present

## 2021-10-19 ENCOUNTER — Ambulatory Visit: Payer: Medicare HMO | Admitting: Cardiology

## 2021-10-19 ENCOUNTER — Encounter: Payer: Self-pay | Admitting: Cardiology

## 2021-10-19 VITALS — BP 138/65 | HR 87 | Temp 98.0°F | Resp 16 | Ht 61.0 in | Wt 176.6 lb

## 2021-10-19 DIAGNOSIS — I700 Atherosclerosis of aorta: Secondary | ICD-10-CM

## 2021-10-19 DIAGNOSIS — R0609 Other forms of dyspnea: Secondary | ICD-10-CM | POA: Diagnosis not present

## 2021-10-19 DIAGNOSIS — R002 Palpitations: Secondary | ICD-10-CM | POA: Diagnosis not present

## 2021-10-19 DIAGNOSIS — E7800 Pure hypercholesterolemia, unspecified: Secondary | ICD-10-CM

## 2021-10-19 NOTE — Progress Notes (Signed)
ID:  Andrea Aguirre, DOB [birth year], MRN [id number]  PCP:  Kathyrn Lass, MD  Cardiologist:  Rex Kras, DO, [facility] (established care 10/19/2021)  REASON FOR CONSULT: Dyspnea & Palpitations  REQUESTING PHYSICIAN:  Angelica Pou, MD [address],  [place] [address]  Chief Complaint  Patient presents with   Palpitations   New Patient (Initial Visit)    HPI  Andrea Aguirre is a 71 y.o. Caucasian female who presents to the clinic for evaluation of dyspnea & palpitation at the request of Angelica Pou, MD. Her past medical history and cardiovascular risk factors include: Hypercholesterolemia, inflammatory arthritis, anxiety, aortic atherosclerosis, fibromyalgia, postmenopausal female  Approximately 10 days ago patient was going up and down the stairs as she works as a Forensic psychologist and started noticing shortness of breath with activity.  The symptoms eventually resolved after resting in a car and she went on her usual activity.  Few days later she was cooking in the kitchen in her usual state of health and started experiencing palpitations.  She sat down and started doing deep breathing exercising and relaxing and the symptoms eventually resolved after a few hours.   Intermittently she has been having shortness of breath with over exertional activities.    Patient denies anginal chest pain or heart failure symptoms.  The symptoms are similar to what she was experiencing when she used to have asthma flareups in the past.  However due to her risk factors she was referred to cardiology for further evaluation and management.  Given her current symptoms she has not been exercising regularly.  FUNCTIONAL STATUS: Swims 2-3 times a week at least 60 minutes.  And enjoys playing pickle ball.  ALLERGIES: Allergies  Allergen Reactions   Codeine Itching   Valacyclovir Hcl Hives    MEDICATION LIST PRIOR TO VISIT: Current Meds  Medication Sig   Cholecalciferol  (VITAMIN D3) 50 MCG (2000 UT) TABS Take 1 tablet by mouth daily at 12 noon.     PAST MEDICAL HISTORY: Past Medical History:  Diagnosis Date   Anxiety    "just when I fly" (12/28/2014)   Chronic fatigue syndrome    "hx; did natural medicine; been cleared up since May 19, 2008"   Depression    "took antidepressant when my mom died in 05-20-95 for 60-90 days; not on RX since" (12/28/2014)   Diverticulitis    Diverticulosis    Elevated LFTs    Fibromyalgia    "hx; did natural medicine; been cleared up since May 19, 2008"   Hypercholesterolemia    Osteoarthritis    "maybe a little for my age" (12/28/2014)   Pneumonia    "probably 3 times in the last 30 years" (12/28/2014)    PAST SURGICAL HISTORY: Past Surgical History:  Procedure Laterality Date   TONSILLECTOMY  03/19/3233   UMBILICAL HERNIA REPAIR  12/28/2014   HERNIA REPAIR UMBILICAL ADULT/INCARCERATED   UMBILICAL HERNIA REPAIR N/A 12/28/2014   Procedure: HERNIA REPAIR UMBILICAL ADULT/INCARCERATED;  Surgeon: Coralie Keens, MD;  Location: [facility];  Service: General;  Laterality: N/A;   VAGINAL HYSTERECTOMY  ~ 1985    FAMILY HISTORY: The patient family history includes COPD in her sister; Lung cancer in her mother.:  SOCIAL HISTORY:  The patient  reports that she quit smoking about 40 years ago. Her smoking use included cigarettes. She has a 0.13 pack-year smoking history. She has never used smokeless tobacco. She reports that she does not currently use alcohol. She reports that she does  not currently use drugs.  REVIEW OF SYSTEMS: Review of Systems  Constitutional: Positive for malaise/fatigue.  Cardiovascular:  Positive for dyspnea on exertion, orthopnea and palpitations. Negative for chest pain, claudication, irregular heartbeat, leg swelling, near-syncope, paroxysmal nocturnal dyspnea and syncope.  Respiratory:  Positive for cough and wheezing. Negative for shortness of breath.   Hematologic/Lymphatic: Negative for bleeding problem.   Musculoskeletal:  Negative for muscle cramps and myalgias.  Neurological:  Negative for dizziness and light-headedness.    PHYSICAL EXAM:    10/19/2021    1:16 PM 10/19/2021    1:10 PM 09/12/2020    2:15 PM  Vitals with BMI  Height  $Remov'5\' 1"'FvnuLn$  $Remove'5\' 1"'yQJOUcf$   Weight  176 lbs 10 oz 162 lbs 10 oz  BMI  45.03 88.82  Systolic 800 349 179  Diastolic 65 70 62  Pulse 87 94 76    CONSTITUTIONAL: Well-developed and well-nourished. No acute distress.  SKIN: Skin is warm and dry. No rash noted. No cyanosis. No pallor. No jaundice HEAD: Normocephalic and atraumatic.  EYES: No scleral icterus MOUTH/THROAT: Moist oral membranes.  NECK: No JVD present. No thyromegaly noted. No carotid bruits  CHEST Normal respiratory effort. No intercostal retractions  LUNGS: Clear to auscultation bilaterally.  No stridor. No wheezes. No rales.  CARDIOVASCULAR: Regular rate and rhythm, positive S1-S2, no murmurs rubs or gallops appreciated. ABDOMINAL: Obese, soft, nontender, nondistended, positive bowel sounds all 4 quadrants. No apparent ascites.  EXTREMITIES: No peripheral edema, warm to touch, 2+ bilateral DP and PT pulses HEMATOLOGIC: No significant bruising NEUROLOGIC: Oriented to person, place, and time. Nonfocal. Normal muscle tone.  PSYCHIATRIC: Normal mood and affect. Normal behavior. Cooperative  CARDIAC DATABASE: EKG: 10/19/2021: Normal sinus rhythm, 81 bpm, without underlying ischemia or injury.  Echocardiogram: No results found for this or any previous visit from the past 1095 days.    Stress Testing: No results found for this or any previous visit from the past 1095 days.   Heart Catheterization: None  LABORATORY DATA: External Labs: Collected: 10/05/2021. Hemoglobin 14.4 g/dL, hematocrit 42.9% BUN 14, creatinine 0.86. eGFR 72. Sodium 140, potassium 4.8, chloride 104, bicarb 30. AST 24, ALT 29, alkaline phosphatase 99 Total cholesterol 242, triglycerides 82, LDL 165, non-HDL 179, HDL  63.  Collected: 07/30/2021. TSH 1.92  IMPRESSION:    ICD-10-CM   1. Dyspnea on exertion  R06.09 PCV ECHOCARDIOGRAM COMPLETE    CT CARDIAC SCORING ([facility] LOCATIONS ONLY)    2. Palpitations  R00.2 EKG 12-Lead    3. Pure hypercholesterolemia  E78.00 PCV MYOCARDIAL PERFUSION WO LEXISCAN    4. Atherosclerosis of aorta (HCC)  I70.0 PCV MYOCARDIAL PERFUSION WO LEXISCAN       RECOMMENDATIONS: REBEKA KIMBLE is a 71 y.o. Caucasian female whose past medical history and cardiac risk factors include: Hypercholesterolemia, inflammatory arthritis, anxiety, aortic atherosclerosis, fibromyalgia, postmenopausal female.  Dyspnea on exertion Currently asymptomatic. Usually brought on by over exertional activities. Office blood pressures are within acceptable limits; however, I have asked her to keep a log of her blood pressures.  Patient states that her home numbers are usually 120 mmHg. May consider monitor to evaluate for possible PVC or A-fib after the ischemic work-up is complete. Echo will be ordered to evaluate for structural heart disease and left ventricular systolic function.  Palpitations EKG shows normal sinus rhythm without any ectopy. Currently asymptomatic. No significant intake of caffeinated beverages. No reversible causes identified. Outside labs independently reviewed We will consider extended Holter monitor if the symptoms after the ischemic  work-up is complete  Pure hypercholesterolemia Fevers not to be on statin medications for now. Reviewed her CT scan from May 2023 and showed her the degree of abdominal aortic atherosclerosis present. She would like to work on lifestyle changes and start [place] and will have her lipids rechecked in 30 days.  If they still remain high she will consider addition of statin therapy. Coronary artery calcium score for further risk stratification.  Atherosclerosis of aorta (HCC) Recommend aspirin 81 mg p.o. daily. Statin discussion as  noted above. Exercise nuclear stress test to evaluate for functional status and reversible ischemia.  Educated on seeking medical attention sooner by going to the closest ER via EMS if the symptoms increase in intensity, frequency, duration, or has typical chest pain as discussed in the office.  Patient verbalized understanding.  Data Reviewed: I have independently reviewed external notes provided by the referring provider as part of this office visit.   I have independently reviewed lipids, EKG, as part of medical decision making. I have ordered the following tests:  Orders Placed This Encounter  Procedures   CT CARDIAC SCORING ([facility] LOCATIONS ONLY)    Standing Status:   Future    Standing Expiration Date:   10/20/2022    Order Specific Question:   Preferred imaging location?    Answer:   [facility]    Order Specific Question:   Release to patient    Answer:   Immediate   PCV MYOCARDIAL PERFUSION WO LEXISCAN    Standing Status:   Future    Standing Expiration Date:   10/20/2022   EKG 12-Lead   PCV ECHOCARDIOGRAM COMPLETE    Standing Status:   Future    Standing Expiration Date:   10/20/2022   FINAL MEDICATION LIST END OF ENCOUNTER: No orders of the defined types were placed in this encounter.   There are no discontinued medications.   Current Outpatient Medications:    Cholecalciferol (VITAMIN D3) 50 MCG (2000 UT) TABS, Take 1 tablet by mouth daily at 12 noon., Disp: , Rfl:  No current facility-administered medications for this visit.  Facility-Administered Medications Ordered in Other Visits:    dextrose 5 % and 0.45 % NaCl with KCl 20 mEq/L infusion, , Intravenous, Continuous, Georganna Skeans, MD   fentaNYL (SUBLIMAZE) injection 25 mcg, 25 mcg, Intravenous, Q1H PRN, Georganna Skeans, MD   ondansetron (ZOFRAN-ODT) disintegrating tablet 4 mg, 4 mg, Oral, Q6H PRN **OR** ondansetron (ZOFRAN) 4 mg in sodium chloride 0.9 % 50 mL IVPB, 4 mg, Intravenous, Q6H PRN, Georganna Skeans,  MD  Orders Placed This Encounter  Procedures   CT CARDIAC SCORING ([facility] LOCATIONS ONLY)   PCV MYOCARDIAL PERFUSION WO LEXISCAN   EKG 12-Lead   PCV ECHOCARDIOGRAM COMPLETE    There are no Patient Instructions on file for this visit.   --Continue cardiac medications as reconciled in final medication list. --Return in about 4 weeks (around 11/16/2021) for Follow up, Dyspnea, Review test results. or sooner if needed. --Continue follow-up with your primary care physician regarding the management of your other chronic comorbid conditions.  Patient's questions and concerns were addressed to her satisfaction. She voices understanding of the instructions provided during this encounter.   This note was created using a voice recognition software as a result there may be grammatical errors inadvertently enclosed that do not reflect the nature of this encounter. Every attempt is made to correct such errors.  Rex Kras, [place], [facility]  Pager: [phone] Office: [phone]

## 2021-10-20 ENCOUNTER — Encounter: Payer: Self-pay | Admitting: Cardiology

## 2021-10-23 NOTE — Telephone Encounter (Signed)
From patient.

## 2021-10-25 DIAGNOSIS — R5383 Other fatigue: Secondary | ICD-10-CM | POA: Diagnosis not present

## 2021-10-25 DIAGNOSIS — E782 Mixed hyperlipidemia: Secondary | ICD-10-CM | POA: Diagnosis not present

## 2021-10-25 DIAGNOSIS — Z6832 Body mass index (BMI) 32.0-32.9, adult: Secondary | ICD-10-CM | POA: Diagnosis not present

## 2021-10-25 DIAGNOSIS — R69 Illness, unspecified: Secondary | ICD-10-CM | POA: Diagnosis not present

## 2021-10-25 DIAGNOSIS — E669 Obesity, unspecified: Secondary | ICD-10-CM | POA: Diagnosis not present

## 2021-10-25 DIAGNOSIS — R0602 Shortness of breath: Secondary | ICD-10-CM | POA: Diagnosis not present

## 2021-10-25 DIAGNOSIS — J984 Other disorders of lung: Secondary | ICD-10-CM | POA: Diagnosis not present

## 2021-10-30 ENCOUNTER — Other Ambulatory Visit (HOSPITAL_BASED_OUTPATIENT_CLINIC_OR_DEPARTMENT_OTHER): Payer: Self-pay | Admitting: Family Medicine

## 2021-10-30 DIAGNOSIS — J984 Other disorders of lung: Secondary | ICD-10-CM

## 2021-10-31 ENCOUNTER — Ambulatory Visit (HOSPITAL_BASED_OUTPATIENT_CLINIC_OR_DEPARTMENT_OTHER)
Admission: RE | Admit: 2021-10-31 | Discharge: 2021-10-31 | Disposition: A | Discharge status: Home / Self Care | Payer: Medicare HMO | Source: Ambulatory Visit | Attending: Family Medicine | Admitting: Family Medicine

## 2021-10-31 DIAGNOSIS — I700 Atherosclerosis of aorta: Secondary | ICD-10-CM | POA: Diagnosis not present

## 2021-10-31 DIAGNOSIS — J984 Other disorders of lung: Secondary | ICD-10-CM | POA: Insufficient documentation

## 2021-11-02 ENCOUNTER — Encounter: Payer: Self-pay | Admitting: Internal Medicine

## 2021-11-02 ENCOUNTER — Ambulatory Visit: Payer: Medicare HMO | Admitting: Internal Medicine

## 2021-11-02 VITALS — BP 138/78 | HR 89 | Wt 176.0 lb

## 2021-11-02 DIAGNOSIS — R0602 Shortness of breath: Secondary | ICD-10-CM

## 2021-11-02 DIAGNOSIS — R058 Other specified cough: Secondary | ICD-10-CM | POA: Insufficient documentation

## 2021-11-02 LAB — POCT EXHALED NITRIC OXIDE: FeNO level (ppb): 20

## 2021-11-02 MED ORDER — FAMOTIDINE 20 MG PO TABS: ORAL_TABLET | ORAL | 11 refills | Status: DC

## 2021-11-02 MED ORDER — PANTOPRAZOLE SODIUM 40 MG PO TBEC: 40.0000 mg | DELAYED_RELEASE_TABLET | Freq: Every day | ORAL | 2 refills | Status: DC

## 2021-11-02 NOTE — Patient Instructions (Signed)
Only use your albuterol as a rescue medication to be used if you can't catch your breath by resting or doing a relaxed purse lip breathing pattern.  - The less you use it, the better it will work when you need it. - Ok to use up to 2 puffs  every 4 hours if you must but call for immediate appointment if use goes up over your usual need - Don't leave home without it !!  (think of it like the spare tire for your car)   Ok to try albuterol 15 min before an activity (on alternating days)  that you know would usually make you short of breath and see if it makes any difference and if makes none then don't take albuterol after activity unless you can't catch your breath as this means it's the resting that helps, not the albuterol.     Work on inhaler technique:  relax and gently blow all the way out then take a nice smooth full deep breath back in, triggering the inhaler at same time you start breathing in.  Hold breath in for at least  5 seconds if you can.    Pantoprazole (protonix) 40 mg   Take  30-60 min before first meal of the day and Pepcid (famotidine)  20 mg after supper until return to office - this is the best way to tell whether stomach acid is contributing to your problem.    GERD (REFLUX)  is an extremely common cause of respiratory symptoms just like yours , many times with no obvious heartburn at all but rather cough/ wheeze/ shortness of breath are the main features that mimic asthma.   It can be treated with medication, but also with lifestyle changes including elevation of the head of your bed (ideally with 6 -8inch blocks under the headboard of your bed),  Smoking cessation, avoidance of late meals, excessive alcohol, and avoid fatty foods, chocolate, peppermint, colas, red wine, and acidic juices such as orange juice.  NO MINT OR MENTHOL PRODUCTS SO NO COUGH DROPS  USE SUGARLESS CANDY INSTEAD (Jolley ranchers or Stover's or Life Savers) or even ice chips will also do - the key is to  swallow to prevent all throat clearing. NO OIL BASED VITAMINS - use powdered substitutes.  Avoid fish oil when coughing.  Please schedule a follow up office visit in 4 weeks, sooner if needed  with all medications /inhalers/ solutions in hand so we can verify exactly what you are taking. This includes all medications from all doctors and over the counters

## 2021-11-02 NOTE — Progress Notes (Unsigned)
Andrea Aguirre, female    DOB: [birth year]   MRN: [id number]   Brief patient profile:  79   yowf minimal smoking hx quit 1983 describes herself as very athletic as adult until hit menopause with wt gain and much less ex  referred to pulmonary clinic 11/02/2021 by Faustino Congress NP for cough then sob with palpitations  > cards w/u in progress as of 11/02/2021      1st noted noct wheeze late spring 2023 -   last covid infection  April 2022   History of Present Illness  11/02/2021  Pulmonary/ 1st office eval/Sonnet Rizor  Chief Complaint  Patient presents with   Consult    Pt consult SOB, coughing and wheezing. Pt has an albuterol inhaler and can tell a difference. PCP did prescribe Breztri but pt hasn't used it yet.  Dyspnea: has to walk slower at HT/FL or gets sob  = MMRC2 = can't walk a nl pace on a flat grade s sob but does fine slow and flat eg  Cough: sporadic tickle in throat/chest daytime / dry/ typically not noct / worse with fumes Sleep: bed is flat/ more pillows help "wheeze' which typically does not wake her up  SABA use: not used yet   Had some wheezing p "bronchitis" several years prior to OV  resolve p just a few days of saba and steroids    No obvious other patterns in day to day or daytime  variability or assoc excess/ purulent sputum or mucus plugs or hemoptysis or cp or chest tightness,   or overt sinus or hb symptoms.   Sleeping  without nocturnal  or early am exacerbation  of respiratory  c/o's or need for noct saba. Also denies any obvious fluctuation of symptoms with weather or environmental changes or other aggravating or alleviating factors except as outlined above   No unusual exposure hx or h/o childhood pna/ asthma or knowledge of premature birth.  Current Allergies, Complete Past Medical History, Past Surgical History, Family History, and Social History were reviewed in [employer] record.  ROS  The following are not active complaints unless  bolded Hoarseness, sore throat, dysphagia, dental problems, itching, sneezing,  nasal congestion or discharge of excess mucus or purulent secretions, ear ache,   fever, chills, sweats, unintended wt loss or wt gain, classically pleuritic or exertional cp,  orthopnea pnd or arm/hand swelling  or leg swelling, presyncope, palpitations, abdominal pain, anorexia, nausea, vomiting, diarrhea  or change in bowel habits or change in bladder habits, change in stools or change in urine, dysuria, hematuria,  rash, arthralgias, visual complaints, headache, numbness, weakness or ataxia or problems with walking or coordination,  change in mood or  memory.             Past Medical History:  Diagnosis Date   Anxiety    "just when I fly" (12/28/2014)   Chronic fatigue syndrome    "hx; did natural medicine; been cleared up since 05/27/2008"   Depression    "took antidepressant when my mom died in 05-28-95 for 60-90 days; not on RX since" (12/28/2014)   Diverticulitis    Diverticulosis    Elevated LFTs    Fibromyalgia    "hx; did natural medicine; been cleared up since 05/27/08"   Hypercholesterolemia    Osteoarthritis    "maybe a little for my age" (12/28/2014)   Pneumonia    "probably 3 times in the last 30 years" (12/28/2014)    Outpatient Medications  Prior to Visit  Medication Sig Dispense Refill   albuterol (VENTOLIN HFA) 108 (90 Base) MCG/ACT inhaler Inhale 2 puffs into the lungs as needed.     Cholecalciferol (VITAMIN D3) 50 MCG (2000 UT) TABS Take 1 tablet by mouth daily at 12 noon.     Facility-Administered Medications Prior to Visit  Medication Dose Route Frequency Provider Last Rate Last Admin   dextrose 5 % and 0.45 % NaCl with KCl 20 mEq/L infusion   Intravenous Continuous Georganna Skeans, MD       fentaNYL (SUBLIMAZE) injection 25 mcg  25 mcg Intravenous Q1H PRN Georganna Skeans, MD       ondansetron (ZOFRAN-ODT) disintegrating tablet 4 mg  4 mg Oral Q6H PRN Georganna Skeans, MD       Or    ondansetron [facility]) 4 mg in sodium chloride 0.9 % 50 mL IVPB  4 mg Intravenous Q6H PRN Georganna Skeans, MD         Objective:     BP 138/78   Pulse 89   Wt 176 lb (79.8 kg)   SpO2 96%   BMI 33.25 kg/m   SpO2: 96 %  Amb wf minimal pseudowheeze / nad   HEENT : Oropharynx  clear      Nasal turbinates nl    NECK :  without  apparent JVD/ palpable Nodes/TM    LUNGS: no acc muscle use,  Nl contour chest which is clear to A and P bilaterally without cough on insp or exp maneuvers   CV:  RRR  no s3 or murmur or increase in P2, and no edema   ABD:  soft and nontender with nl inspiratory excursion in the supine position. No bruits or organomegaly appreciated   MS:  Nl gait/ ext warm without deformities Or obvious joint restrictions  calf tenderness, cyanosis or clubbing    SKIN: warm and dry without lesions    NEURO:  alert, approp, nl sensorium with  no motor or cerebellar deficits apparent.      I personally reviewed images and agree with radiology impression as follows:   Chest CT 10/31/21 s contrast (not hrct)  1. Mild linear opacities of the lower lungs, likely due to scarring or atelectasis. No acute airspace opacity. 2. Mild coronary artery calcifications of the LAD. 3. Mild aortic Atherosclerosis (ICD10-I70.0).       Assessment   Upper airway cough syndrome vs cough variant asthma  Onset Spring 2023 with doe/palpitations  - FENO  20 11/02/2021  - Spirometry 11/02/2021  FEV1 1.54 (79%)  Ratio 0.82 nl f/v off all rx   - 11/02/2021   Walked on RA  x  3  lap(s) =  approx 525  ft  @ mod pace, stopped due to end of study s wheeze or sob  with lowest 02 sats 95%    Although the cough and noct "wheezes" and prior reported response to saba sound like asthma, and she may well prove to have it, the absence of any smoking or atopic hx and non-specific triggers is more c/w GERD with UACS or asthma as the secondary problem.  If she still has wheeze noct or doe not cardiac  related, once her cards eval is complete she can certainly try saba:  Re SABA :  I spent extra time with pt today reviewing appropriate use of albuterol for prn use on exertion with the following points: 1) saba is for relief of sob that does not improve by walking a slower  pace or resting but rather if the pt does not improve after trying this first. 2) If the pt is convinced, as many are, that saba helps recover from activity faster then it's easy to tell if this is the case by re-challenging : ie stop, take the inhaler, then p 5 minutes try the exact same activity (intensity of workload) that just caused the symptoms and see if they are substantially diminished or not after saba 3) if there is an activity that reproducibly causes the symptoms, try the saba 15 min before the activity on alternate days   If in fact the saba really does help, then fine to continue to use it prn but advised may need to look closer at the maintenance regimen being used to achieve better control of airways disease with exertion.    Of the three most common causes of  Sub-acute / recurrent or chronic cough, only one (GERD)  can actually contribute to/ trigger  the other two (asthma and post nasal drip syndrome)  and perpetuate the cylce of cough.  While not intuitively obvious, many patients with chronic low grade reflux do not cough until there is a primary insult that disturbs the protective epithelial barrier and exposes sensitive nerve endings.   This is typically viral but can due to PNDS and  either may apply here.   The point is that once this occurs, it is difficult to eliminate the cycle  using anything but a maximally effective acid suppression regimen at least in the short run, accompanied by an appropriate diet to address non acid GERD and encourage the use of hard rock candies to suppress the urge to clear the throat.   - The proper method of use, as well as anticipated side effects, of a metered-dose inhaler  were discussed and demonstrated to the patient using teach back method.    F/u in 4 wk (with cards eval  Planned to be complete in the next 2 weeks) but encouraged to call sooner if needed.    Each maintenance medication was reviewed in detail including emphasizing most importantly the difference between maintenance and prns and under what circumstances the prns are to be triggered using an action plan format where appropriate.  Total time for H and P, chart review, counseling, reviewing hfa device(s) , directly observing portions of ambulatory 02 saturation study/ and generating customized AVS unique to this office visit / same day charting =47 min / new pt with refractory resp symptoms of unknown origin                  Christinia Gully, MD 11/02/2021

## 2021-11-03 ENCOUNTER — Encounter: Payer: Self-pay | Admitting: Internal Medicine

## 2021-11-03 NOTE — Assessment & Plan Note (Addendum)
Onset Spring 2023 with doe/palpitations  - FENO  20 11/02/2021  - Spirometry 11/02/2021  FEV1 1.54 (79%)  Ratio 0.82 nl f/v off all rx   - 11/02/2021   Walked on RA  x  3  lap(s) =  approx 525  ft  @ mod pace, stopped due to end of study s wheeze or sob  with lowest 02 sats 95%    Although the cough and noct "wheezes" and prior reported response to saba sound like asthma, and she may well prove to have it, the absence of any smoking or atopic hx and non-specific triggers is more c/w GERD with UACS or asthma as the secondary problem.  If she still has wheeze noct or doe not cardiac related, once her cards eval is complete she can certainly try saba:  Re SABA :  I spent extra time with pt today reviewing appropriate use of albuterol for prn use on exertion with the following points: 1) saba is for relief of sob that does not improve by walking a slower pace or resting but rather if the pt does not improve after trying this first. 2) If the pt is convinced, as many are, that saba helps recover from activity faster then it's easy to tell if this is the case by re-challenging : ie stop, take the inhaler, then p 5 minutes try the exact same activity (intensity of workload) that just caused the symptoms and see if they are substantially diminished or not after saba 3) if there is an activity that reproducibly causes the symptoms, try the saba 15 min before the activity on alternate days   If in fact the saba really does help, then fine to continue to use it prn but advised may need to look closer at the maintenance regimen being used to achieve better control of airways disease with exertion.    Of the three most common causes of  Sub-acute / recurrent or chronic cough, only one (GERD)  can actually contribute to/ trigger  the other two (asthma and post nasal drip syndrome)  and perpetuate the cylce of cough.  While not intuitively obvious, many patients with chronic low grade reflux do not cough until  there is a primary insult that disturbs the protective epithelial barrier and exposes sensitive nerve endings.   This is typically viral but can due to PNDS and  either may apply here.   The point is that once this occurs, it is difficult to eliminate the cycle  using anything but a maximally effective acid suppression regimen at least in the short run, accompanied by an appropriate diet to address non acid GERD and encourage the use of hard rock candies to suppress the urge to clear the throat.   - The proper method of use, as well as anticipated side effects, of a metered-dose inhaler were discussed and demonstrated to the patient using teach back method.    F/u in 4 wk (with cards eval  Planned to be complete in the next 2 weeks) but encouraged to call sooner if needed.    Each maintenance medication was reviewed in detail including emphasizing most importantly the difference between maintenance and prns and under what circumstances the prns are to be triggered using an action plan format where appropriate.  Total time for H and P, chart review, counseling, reviewing hfa device(s) , directly observing portions of ambulatory 02 saturation study/ and generating customized AVS unique to this office visit / same day charting =47  min / new pt with refractory resp symptoms of unknown origin

## 2021-11-05 ENCOUNTER — Ambulatory Visit: Payer: Medicare HMO

## 2021-11-05 DIAGNOSIS — E7800 Pure hypercholesterolemia, unspecified: Secondary | ICD-10-CM | POA: Diagnosis not present

## 2021-11-05 DIAGNOSIS — R0609 Other forms of dyspnea: Secondary | ICD-10-CM | POA: Diagnosis not present

## 2021-11-05 DIAGNOSIS — I700 Atherosclerosis of aorta: Secondary | ICD-10-CM | POA: Diagnosis not present

## 2021-11-19 DIAGNOSIS — E559 Vitamin D deficiency, unspecified: Secondary | ICD-10-CM | POA: Diagnosis not present

## 2021-11-19 DIAGNOSIS — E782 Mixed hyperlipidemia: Secondary | ICD-10-CM | POA: Diagnosis not present

## 2021-11-19 DIAGNOSIS — Z6833 Body mass index (BMI) 33.0-33.9, adult: Secondary | ICD-10-CM | POA: Diagnosis not present

## 2021-11-19 DIAGNOSIS — Z1331 Encounter for screening for depression: Secondary | ICD-10-CM | POA: Diagnosis not present

## 2021-11-19 DIAGNOSIS — E669 Obesity, unspecified: Secondary | ICD-10-CM | POA: Diagnosis not present

## 2021-11-19 DIAGNOSIS — J4540 Moderate persistent asthma, uncomplicated: Secondary | ICD-10-CM | POA: Diagnosis not present

## 2021-11-19 DIAGNOSIS — I700 Atherosclerosis of aorta: Secondary | ICD-10-CM | POA: Diagnosis not present

## 2021-11-19 DIAGNOSIS — R7309 Other abnormal glucose: Secondary | ICD-10-CM | POA: Diagnosis not present

## 2021-11-23 ENCOUNTER — Other Ambulatory Visit: Payer: Medicare HMO

## 2021-11-27 ENCOUNTER — Ambulatory Visit: Payer: Medicare HMO | Admitting: Cardiology

## 2021-11-28 ENCOUNTER — Ambulatory Visit: Payer: Medicare HMO | Admitting: Internal Medicine

## 2021-11-29 ENCOUNTER — Ambulatory Visit: Payer: Medicare HMO

## 2021-11-29 DIAGNOSIS — R0609 Other forms of dyspnea: Secondary | ICD-10-CM

## 2021-12-11 ENCOUNTER — Encounter: Payer: Self-pay | Admitting: Cardiology

## 2021-12-11 ENCOUNTER — Ambulatory Visit: Payer: Medicare HMO | Admitting: Cardiology

## 2021-12-11 VITALS — BP 144/81 | HR 89 | Temp 97.6°F | Resp 16 | Ht 61.0 in | Wt 178.2 lb

## 2021-12-11 DIAGNOSIS — E7800 Pure hypercholesterolemia, unspecified: Secondary | ICD-10-CM | POA: Diagnosis not present

## 2021-12-11 DIAGNOSIS — R0609 Other forms of dyspnea: Secondary | ICD-10-CM | POA: Diagnosis not present

## 2021-12-11 DIAGNOSIS — I700 Atherosclerosis of aorta: Secondary | ICD-10-CM

## 2021-12-11 DIAGNOSIS — R931 Abnormal findings on diagnostic imaging of heart and coronary circulation: Secondary | ICD-10-CM | POA: Diagnosis not present

## 2021-12-11 NOTE — Progress Notes (Signed)
ID:  Andrea Aguirre, DOB 24-Dec-1950, MRN [id number]  PCP:  Faustino Congress, NP  Cardiologist:  Rex Kras, DO, [facility] (established care 10/19/2021)  Date: 12/11/21 Last Office Visit: 10/19/2021  Chief Complaint  Patient presents with   Results   Follow-up    Reevaluation of shortness of breath    HPI  Andrea Aguirre is a 71 y.o. Caucasian female whose past medical history and cardiovascular risk factors include: Hypercholesterolemia, inflammatory arthritis, anxiety, aortic atherosclerosis, fibromyalgia, postmenopausal female  Patient was seen in consult back in August 2023 for evaluation of dyspnea on exertion and palpitations.  At last office visit the shared decision was to proceed with an echocardiogram, stress test, and coronary artery calcification score for further risk stratification.  Results of these diagnostic tests is reviewed with her at today's office visit and noted below for further reference.  Clinically she denies anginal discomfort.  Her shortness of breath with effort related activities along with cough/orthopnea has resolved.  She started red yeast rice to help address her hypercholesterolemia and she had repeat labs (records not available for comparison).  But she does note some improvement in her LDL.  FUNCTIONAL STATUS: Swims 2-3 times a week at least 60 minutes.  And enjoys playing pickle ball.  ALLERGIES: Allergies  Allergen Reactions   Codeine Itching   Valacyclovir Hcl Hives    MEDICATION LIST PRIOR TO VISIT: Current Meds  Medication Sig   albuterol (VENTOLIN HFA) 108 (90 Base) MCG/ACT inhaler Inhale 2 puffs into the lungs as needed.   aspirin EC 81 MG tablet Take 81 mg by mouth daily. Swallow whole.   Cholecalciferol (VITAMIN D3) 50 MCG (2000 UT) TABS Take 1 tablet by mouth daily at 12 noon.   famotidine (PEPCID) 20 MG tablet One after supper   pantoprazole (PROTONIX) 40 MG tablet Take 1 tablet (40 mg total) by mouth daily. Take 30-60 min  before first meal of the day     PAST MEDICAL HISTORY: Past Medical History:  Diagnosis Date   Anxiety    "just when I fly" (12/28/2014)   Chronic fatigue syndrome    "hx; did natural medicine; been cleared up since 2008/04/24"   Depression    "took antidepressant when my mom died in 04/25/95 for 60-90 days; not on RX since" (12/28/2014)   Diverticulitis    Diverticulosis    Elevated LFTs    Fibromyalgia    "hx; did natural medicine; been cleared up since 04/24/08"   Hypercholesterolemia    Osteoarthritis    "maybe a little for my age" (12/28/2014)   Pneumonia    "probably 3 times in the last 30 years" (12/28/2014)    PAST SURGICAL HISTORY: Past Surgical History:  Procedure Laterality Date   TONSILLECTOMY  40/98/1191   UMBILICAL HERNIA REPAIR  12/28/2014   HERNIA REPAIR UMBILICAL ADULT/INCARCERATED   UMBILICAL HERNIA REPAIR N/A 12/28/2014   Procedure: HERNIA REPAIR UMBILICAL ADULT/INCARCERATED;  Surgeon: Coralie Keens, MD;  Location: [place];  Service: General;  Laterality: N/A;   VAGINAL HYSTERECTOMY  ~ 1985    FAMILY HISTORY: The patient family history includes COPD in her sister; Lung cancer in her mother.:  SOCIAL HISTORY:  The patient  reports that she quit smoking about 40 years ago. Her smoking use included cigarettes. She has a 0.13 pack-year smoking history. She has never used smokeless tobacco. She reports that she does not currently use alcohol. She reports that she does not currently use drugs.  REVIEW OF SYSTEMS: Review  of Systems  Cardiovascular:  Negative for chest pain, claudication, dyspnea on exertion, irregular heartbeat, leg swelling, near-syncope, orthopnea, palpitations, paroxysmal nocturnal dyspnea and syncope.  Respiratory:  Positive for wheezing (Improved). Negative for shortness of breath.   Hematologic/Lymphatic: Negative for bleeding problem.  Musculoskeletal:  Negative for muscle cramps and myalgias.  Neurological:  Negative for dizziness and  light-headedness.   PHYSICAL EXAM:    12/11/2021    4:07 PM 11/02/2021    2:57 PM 10/19/2021    1:16 PM  Vitals with BMI  Height 5' 1"    Weight 178 lbs 3 oz 176 lbs   BMI 74.14 23.95   Systolic 320 233 435  Diastolic 81 78 65  Pulse 89 89 87    CONSTITUTIONAL: Well-developed and well-nourished. No acute distress.  SKIN: Skin is warm and dry. No rash noted. No cyanosis. No pallor. No jaundice HEAD: Normocephalic and atraumatic.  EYES: No scleral icterus MOUTH/THROAT: Moist oral membranes.  NECK: No JVD present. No thyromegaly noted. No carotid bruits  CHEST Normal respiratory effort. No intercostal retractions  LUNGS: Clear to auscultation bilaterally.  No stridor. No wheezes. No rales.  CARDIOVASCULAR: Regular rate and rhythm, positive S1-S2, no murmurs rubs or gallops appreciated. ABDOMINAL: Obese, soft, nontender, nondistended, positive bowel sounds all 4 quadrants. No apparent ascites.  EXTREMITIES: No peripheral edema, warm to touch, 2+ bilateral DP and PT pulses HEMATOLOGIC: No significant bruising NEUROLOGIC: Oriented to person, place, and time. Nonfocal. Normal muscle tone.  PSYCHIATRIC: Normal mood and affect. Normal behavior. Cooperative  CARDIAC DATABASE: EKG: 10/19/2021: Normal sinus rhythm, 81 bpm, without underlying ischemia or injury.  Echocardiogram: 11/29/2021:  Normal LV systolic function with visual EF 60-65%. Left ventricle cavity is normal in size. Normal left ventricular wall thickness. Normal global wall motion. Normal diastolic filling pattern, normal LAP.  Trace tricuspid regurgitation. No evidence of pulmonary hypertension.  No prior study for comparison.   Stress Testing: Exercise nuclear stress test 11/05/21 Myocardial perfusion is normal. Low risk study. Overall LV systolic function is normal without regional wall motion abnormalities. Stress LV EF: 66%.  Normal ECG stress. The patient exercised for 4 minutes and 32 seconds of a Bruce protocol,  achieving approximately 6.46 METs and reached 93% MPHR. The heart rate response was normal. The blood pressure response was normal. No previous exam available for comparison.  Heart Catheterization: None  CT Cardiac Scoring: 10/22/2021 Total CAC 1 AU, 37th percentile for patient's age, sex, and race. Noncardiac findings: Bibasilar scarring within the visual lung fields.  LABORATORY DATA: External Labs: Collected: 10/05/2021. Hemoglobin 14.4 g/dL, hematocrit 42.9% BUN 14, creatinine 0.86. eGFR 72. Sodium 140, potassium 4.8, chloride 104, bicarb 30. AST 24, ALT 29, alkaline phosphatase 99 Total cholesterol 242, triglycerides 82, LDL 165, non-HDL 179, HDL 63.  Collected: 07/30/2021. TSH 1.92  IMPRESSION:    ICD-10-CM   1. Dyspnea on exertion  R06.09     2. Pure hypercholesterolemia  E78.00     3. Atherosclerosis of aorta (HCC)  I70.0     4. Agatston coronary artery calcium score less than 100  R93.1         RECOMMENDATIONS: Andrea Aguirre is a 71 y.o. Caucasian female whose past medical history and cardiac risk factors include: Hypercholesterolemia, inflammatory arthritis, anxiety, aortic atherosclerosis, fibromyalgia, postmenopausal female.  Dyspnea on exertion Resolved since last office visit. Has undergone appropriate ischemic work-up as outlined above. No additional work-up from a cardiovascular standpoint warranted at this time. We emphasized the importance of improving  her modifiable cardiovascular risk factors.  Pure hypercholesterolemia As of July 2023 LDL 165 mg/dL. Currently using red yeast rice and implementing lifestyle changes. Plans to have repeat lipids with PCP in 6 weeks after implementing the above-mentioned changes. If the LDL levels are still greater than 99 mg/dL would recommend statin therapy.  Atherosclerosis of aorta (HCC) /mild coronary artery calcification Noted on nongated CT study in the past.  We will like to see her back in 1 year  after her yearly well visit to reevaluate her symptoms and go over labs.  Patient is agreeable with the plan of care.  No orders of the defined types were placed in this encounter.  FINAL MEDICATION LIST END OF ENCOUNTER: No orders of the defined types were placed in this encounter.   There are no discontinued medications.   Current Outpatient Medications:    albuterol (VENTOLIN HFA) 108 (90 Base) MCG/ACT inhaler, Inhale 2 puffs into the lungs as needed., Disp: , Rfl:    aspirin EC 81 MG tablet, Take 81 mg by mouth daily. Swallow whole., Disp: , Rfl:    Cholecalciferol (VITAMIN D3) 50 MCG (2000 UT) TABS, Take 1 tablet by mouth daily at 12 noon., Disp: , Rfl:    famotidine (PEPCID) 20 MG tablet, One after supper, Disp: 30 tablet, Rfl: 11   pantoprazole (PROTONIX) 40 MG tablet, Take 1 tablet (40 mg total) by mouth daily. Take 30-60 min before first meal of the day, Disp: 30 tablet, Rfl: 2 No current facility-administered medications for this visit.  Facility-Administered Medications Ordered in Other Visits:    dextrose 5 % and 0.45 % NaCl with KCl 20 mEq/L infusion, , Intravenous, Continuous, Georganna Skeans, MD   fentaNYL (SUBLIMAZE) injection 25 mcg, 25 mcg, Intravenous, Q1H PRN, Georganna Skeans, MD   ondansetron (ZOFRAN-ODT) disintegrating tablet 4 mg, 4 mg, Oral, Q6H PRN **OR** ondansetron (ZOFRAN) 4 mg in sodium chloride 0.9 % 50 mL IVPB, 4 mg, Intravenous, Q6H PRN, Georganna Skeans, MD  No orders of the defined types were placed in this encounter.   There are no Patient Instructions on file for this visit.   --Continue cardiac medications as reconciled in final medication list. --Return in about 1 year (around 12/12/2022) for Annual follow up visit. or sooner if needed. --Continue follow-up with your primary care physician regarding the management of your other chronic comorbid conditions.  Patient's questions and concerns were addressed to her satisfaction. She voices understanding  of the instructions provided during this encounter.   This note was created using a voice recognition software as a result there may be grammatical errors inadvertently enclosed that do not reflect the nature of this encounter. Every attempt is made to correct such errors.  Rex Kras, [place], [facility]  Pager: [phone] Office: [phone]

## 2021-12-12 ENCOUNTER — Ambulatory Visit: Payer: Medicare HMO | Admitting: Internal Medicine

## 2021-12-12 NOTE — Progress Notes (Deleted)
Andrea Aguirre, female    DOB: [birth year]   MRN: [id number]   Brief patient profile:  71   yowf minimal smoking hx quit 1983 describes herself as very athletic as adult until hit menopause with wt gain and much less ex  referred to pulmonary clinic 11/02/2021 by Faustino Congress NP for cough then sob with palpitations  > cards w/u in progress as of 11/02/2021      1st noted noct wheeze late spring 2023 -   last covid infection  April 2022   History of Present Illness  11/02/2021  Pulmonary/ 1st office eval/[name]  Chief Complaint  Patient presents with   Consult    Pt consult SOB, coughing and wheezing. Pt has an albuterol inhaler and can tell a difference. PCP did prescribe Breztri but pt hasn't used it yet.  Dyspnea: has to walk slower at HT/FL or gets sob  = MMRC2 = can't walk a nl pace on a flat grade s sob but does fine slow and flat eg  Cough: sporadic tickle in throat/chest daytime / dry/ typically not noct / worse with fumes Sleep: bed is flat/ more pillows help "wheeze' which typically does not wake her up  SABA use: not used yet  Rec Only use your albuterol as a rescue medication  Ok to try albuterol 15 min before an activity (on alternating days)  that you know would usually make you short of breath  Work on inhaler technique:   Pantoprazole (protonix) 40 mg   Take  30-60 min before first meal of the day and Pepcid (famotidine)  20 mg after supper until return to office  GERD diet/ bed blocks Please schedule a follow up office visit in 4 weeks, sooner if needed  with all medications /inhalers/ solutions in hand       12/12/2021  f/u ov/[name] re: ***   maint on ***  No chief complaint on file.   Dyspnea:  *** Cough: *** Sleeping: *** SABA use: *** 02: *** Covid status:   ***   No obvious day to day or daytime variability or assoc excess/ purulent sputum or mucus plugs or hemoptysis or cp or chest tightness, subjective wheeze or overt sinus or hb symptoms.   ***  without nocturnal  or early am exacerbation  of respiratory  c/o's or need for noct saba. Also denies any obvious fluctuation of symptoms with weather or environmental changes or other aggravating or alleviating factors except as outlined above   No unusual exposure hx or h/o childhood pna/ asthma or knowledge of premature birth.  Current Allergies, Complete Past Medical History, Past Surgical History, Family History, and Social History were reviewed in [employer] record.  ROS  The following are not active complaints unless bolded Hoarseness, sore throat, dysphagia, dental problems, itching, sneezing,  nasal congestion or discharge of excess mucus or purulent secretions, ear ache,   fever, chills, sweats, unintended wt loss or wt gain, classically pleuritic or exertional cp,  orthopnea pnd or arm/hand swelling  or leg swelling, presyncope, palpitations, abdominal pain, anorexia, nausea, vomiting, diarrhea  or change in bowel habits or change in bladder habits, change in stools or change in urine, dysuria, hematuria,  rash, arthralgias, visual complaints, headache, numbness, weakness or ataxia or problems with walking or coordination,  change in mood or  memory.        No outpatient medications have been marked as taking for the 12/12/21 encounter (Appointment) with Tanda Rockers, MD.  Past Medical History:  Diagnosis Date   Anxiety    "just when I fly" (12/28/2014)   Chronic fatigue syndrome    "hx; did natural medicine; been cleared up since 05/24/2008"   Depression    "took antidepressant when my mom died in 05/25/1995 for 60-90 days; not on RX since" (12/28/2014)   Diverticulitis    Diverticulosis    Elevated LFTs    Fibromyalgia    "hx; did natural medicine; been cleared up since 05/24/08"   Hypercholesterolemia    Osteoarthritis    "maybe a little for my age" (12/28/2014)   Pneumonia    "probably 3 times in the last 30 years" (12/28/2014)       Objective:    Wt Readings from Last 3 Encounters:  12/11/21 178 lb 3.2 oz (80.8 kg)  11/02/21 176 lb (79.8 kg)  10/19/21 176 lb 9.6 oz (80.1 kg)      Vital signs reviewed  12/12/2021  - Note at rest 02 sats  ***% on ***   General appearance:    ***                    Assessment

## 2021-12-26 ENCOUNTER — Encounter: Payer: Self-pay | Admitting: Internal Medicine

## 2021-12-26 ENCOUNTER — Ambulatory Visit (INDEPENDENT_AMBULATORY_CARE_PROVIDER_SITE_OTHER): Payer: Medicare HMO | Admitting: Internal Medicine

## 2021-12-26 DIAGNOSIS — R058 Other specified cough: Secondary | ICD-10-CM

## 2021-12-26 NOTE — Progress Notes (Signed)
Andrea Aguirre, female    DOB: 05/26/1950   MRN: [id number]   Brief patient profile:  6   yowf minimal smoking hx quit 1983 describes herself as very athletic as adult until hit menopause with wt gain and much less ex  referred to pulmonary clinic 11/02/2021 by Faustino Congress NP for cough then sob with palpitations  > cards w/u in progress as of 11/02/2021      1st noted noct wheeze late spring 2023 -   last covid infection  April 2022   History of Present Illness  11/02/2021  Pulmonary/ 1st office eval/Korene Dula  Chief Complaint  Patient presents with   Consult    Pt consult SOB, coughing and wheezing. Pt has an albuterol inhaler and can tell a difference. PCP did prescribe Breztri but pt hasn't used it yet.  Dyspnea: has to walk slower at HT/FL or gets sob  = MMRC2 = can't walk a nl pace on a flat grade s sob but does fine slow and flat eg  Cough: sporadic tickle in throat/chest daytime / dry/ typically not noct / worse with fumes Sleep: bed is flat/ more pillows help "wheeze' which typically does not wake her up  SABA use: not used yet  Rec Only use your albuterol as a rescue medication  Ok to try albuterol 15 min before an activity (on alternating days)  that you know would usually make you short of breath  Work on inhaler technique:   Pantoprazole (protonix) 40 mg   Take  30-60 min before first meal of the day and Pepcid (famotidine)  20 mg after supper until return to office  GERD diet/ bed blocks Please schedule a follow up office visit in 4 weeks, sooner if needed  with all medications /inhalers/ solutions in hand       12/26/2021  f/u ov/Jailynne Opperman re: uacs vs asthma   maint on gerd rx/ diet  no resp rex  No chief complaint on file.  Dyspnea:  [place] no symptoms or need for saba  Cough: none  Sleeping: now flat  SABA use: not needing  02: none  Wheezing at hs has not completely resolved, does not wake her    No obvious day to day or daytime  variability or assoc excess/ purulent sputum or mucus plugs or hemoptysis or cp or chest tightness,  overt sinus or hb symptoms.   Sleeping  without nocturnal  or early am exacerbation  of respiratory  c/o's or need for noct saba. Also denies any obvious fluctuation of symptoms with weather or environmental changes or other aggravating or alleviating factors except as outlined above   No unusual exposure hx or h/o childhood pna/ asthma or knowledge of premature birth.  Current Allergies, Complete Past Medical History, Past Surgical History, Family History, and Social History were reviewed in [employer] record.  ROS  The following are not active complaints unless bolded Hoarseness, sore throat, dysphagia, dental problems, itching, sneezing,  nasal congestion or discharge of excess mucus or purulent secretions, ear ache,   fever, chills, sweats, unintended wt loss or wt gain, classically pleuritic or exertional cp,  orthopnea pnd or arm/hand swelling  or leg swelling, presyncope, palpitations, abdominal pain, anorexia, nausea, vomiting, diarrhea  or change in bowel habits or change in bladder habits, change in stools or change in urine, dysuria, hematuria,  rash, arthralgias, visual complaints, headache, numbness, weakness or ataxia or problems with walking or coordination,  change in mood or  memory.        Current Meds  Medication Sig   albuterol (VENTOLIN HFA) 108 (90 Base) MCG/ACT inhaler Inhale 2 puffs into the lungs as needed.   aspirin EC 81 MG tablet Take 81 mg by mouth daily. Swallow whole.   Cholecalciferol (VITAMIN D3) 50 MCG (2000 UT) TABS Take 1 tablet by mouth daily at 12 noon.   famotidine (PEPCID) 20 MG tablet One after supper   pantoprazole (PROTONIX) 40 MG tablet Take 1 tablet (40 mg total) by mouth daily. Take 30-60 min before first meal of the day                      Past Medical History:  Diagnosis Date   Anxiety    "just when I fly"  (12/28/2014)   Chronic fatigue syndrome    "hx; did natural medicine; been cleared up since 06-20-08"   Depression    "took antidepressant when my mom died in 06/21/1995 for 60-90 days; not on RX since" (12/28/2014)   Diverticulitis    Diverticulosis    Elevated LFTs    Fibromyalgia    "hx; did natural medicine; been cleared up since 06-20-08"   Hypercholesterolemia    Osteoarthritis    "maybe a little for my age" (12/28/2014)   Pneumonia    "probably 3 times in the last 30 years" (12/28/2014)      Objective:    Wt Readings from Last 3 Encounters:  12/26/21 176 lb (79.8 kg)  12/11/21 178 lb 3.2 oz (80.8 kg)  11/02/21 176 lb (79.8 kg)      Vital signs reviewed  12/26/2021  - Note at rest 02 sats  98% on RA   General appearance:    amb pleasant wf nad   HEENT : Oropharynx  clear      Nasal turbinates nl    NECK :  without  apparent JVD/ palpable Nodes/TM    LUNGS: no acc muscle use,  Nl contour chest which is clear to A and P bilaterally without cough on insp or exp maneuvers   CV:  RRR  no s3 or murmur or increase in P2, and no edema   ABD:  soft and nontender with nl inspiratory excursion in the supine position. No bruits or organomegaly appreciated   MS:  Nl gait/ ext warm without deformities Or obvious joint restrictions  calf tenderness, cyanosis or clubbing    SKIN: warm and dry without lesions    NEURO:  alert, approp, nl sensorium with  no motor or cerebellar deficits apparent.             Assessment

## 2021-12-26 NOTE — Patient Instructions (Addendum)
3 weeks prior to next visit  1) stop pantoprazole (or prilosec otc 20 mg )  2) increase pepcid to 20 mg one twice daily after meals x one week (after breakfast and supper)  3) after one week if no cough reduce pepcid to 20 mg after supper x one more week and  then stop  If worse cough or acid symptoms go back one step and try the next step after 2 weeks with 2 weeks between each step down.   Bed blocks under the head of the bed   Please schedule a follow up visit in 3 months but call sooner if needed

## 2021-12-26 NOTE — Assessment & Plan Note (Signed)
Onset Spring 2023 with doe/palpitations  - FENO  20 11/02/2021  - Spirometry 11/02/2021  FEV1 1.54 (79%)  Ratio 0.82 nl f/v off all rx   - 11/02/2021   Walked on RA  x  3  lap(s) =  approx 750  ft  @ mod pace, stopped due to end of study s wheeze or sob  with lowest 02 sats 95%   - 12/26/2021 reported 100% better on gerd rx/ off all resp rx so rec 2.5 m more rx and then taper off gerd rx and ov at 3 m  Convincing improvement in all her resp cc with rx of gerd so will complete another few months of rx then taper off.  Discussed the recent press about ppi's in the context of a statistically significant (but questionably clinically relevant) increase in CRI in pts on ppi vs h2's > bottom line is the lowest dose of ppi that controls   gerd is the right dose and if that dose is zero that's fine esp since h2's are cheaper > see taper schedule on avs         Each maintenance medication was reviewed in detail including emphasizing most importantly the difference between maintenance and prns and under what circumstances the prns are to be triggered using an action plan format where appropriate.  Total time for H and P, chart review, counseling, reviewing ha  device(s) and generating customized AVS unique to this office visit / same day charting = 25 min

## 2022-01-06 IMAGING — CT CT ABD-PELV W/ CM
2 of 5 series · 17 of 46 positions shown, 19 images · IV contrast (APPLIED)
Comparison: None.

CLINICAL DATA: 70-year-old female with abdominal distension.

EXAM:
CT ABDOMEN AND PELVIS WITH CONTRAST
TECHNIQUE: Multidetector CT imaging of the abdomen and pelvis was performed
using the standard protocol following bolus administration of
intravenous contrast.
CONTRAST:  100mL OMNIPAQUE IOHEXOL 300 MG/ML  SOLN

[Series 2: abd pel w · axial · 0.73mm/px · z∈[+869,+1234]mm · 14 of 83 slices shown, 16 images]
[im 5/83  soft-tissue]
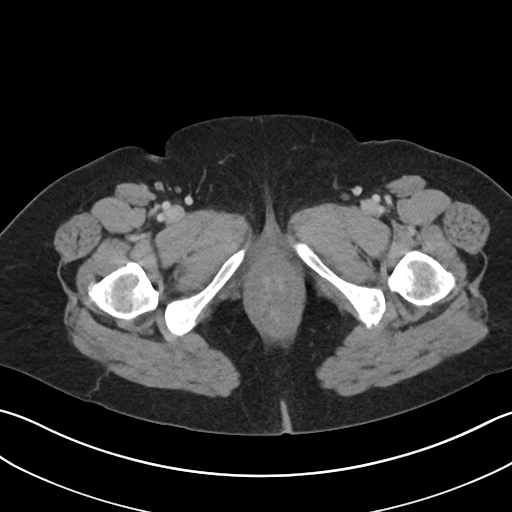
[im 5/83  bone]
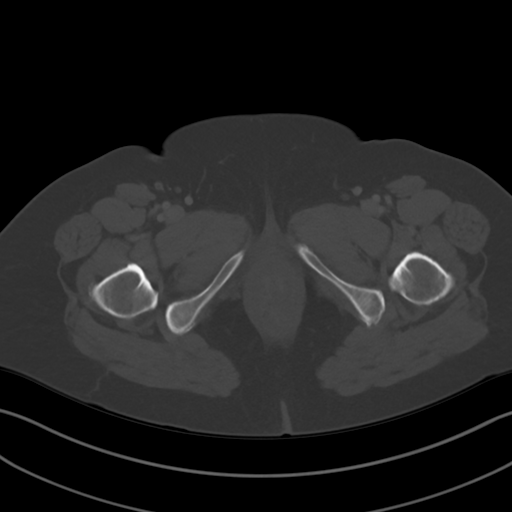
[im 9/83  soft-tissue]
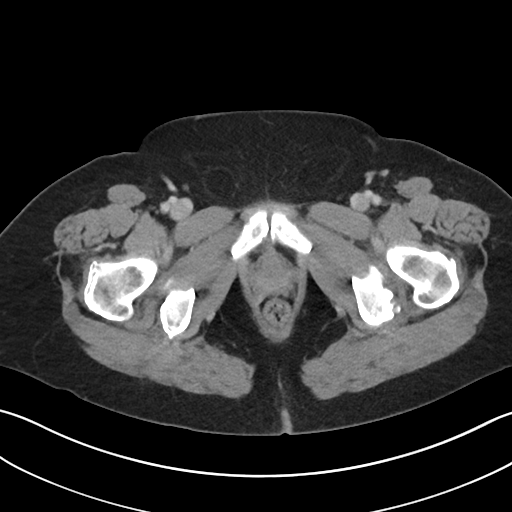
[im 18/83  soft-tissue]
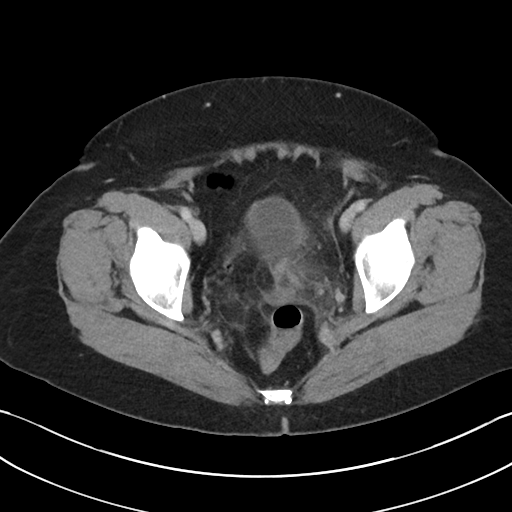
[im 22/83  soft-tissue]
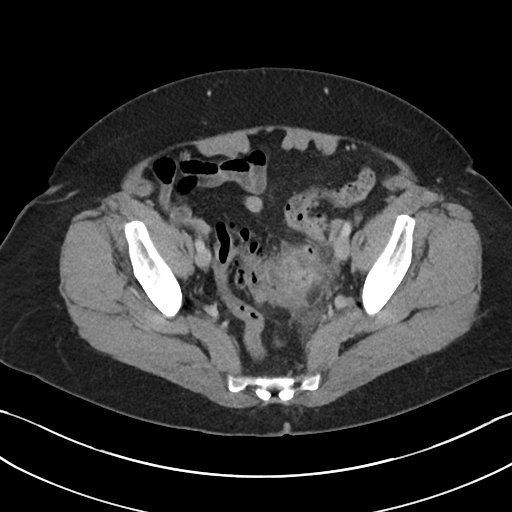
[im 26/83  soft-tissue]
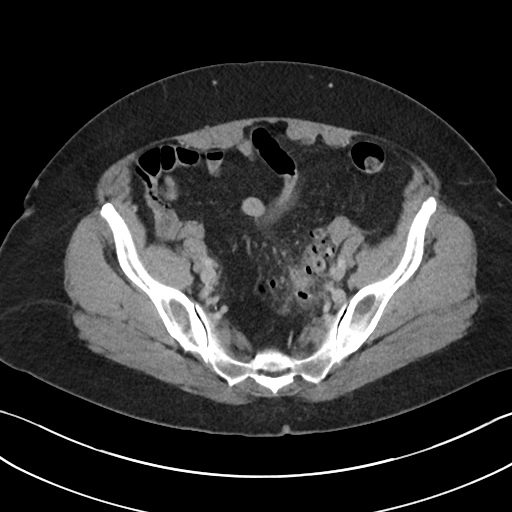
[im 35/83  soft-tissue]
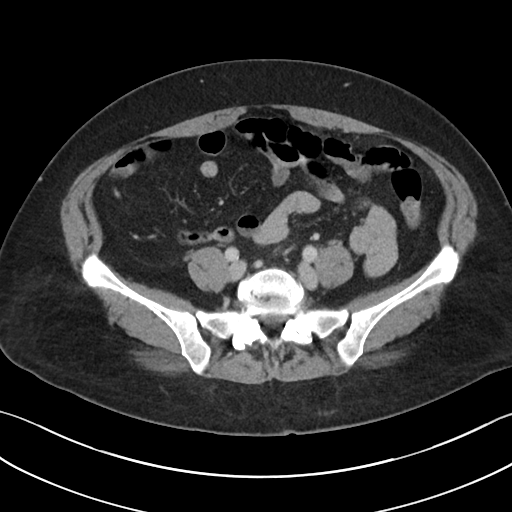
[im 39/83  soft-tissue]
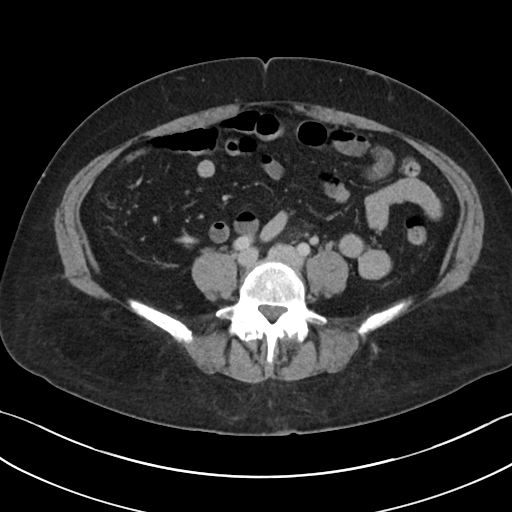
[im 44/83  soft-tissue]
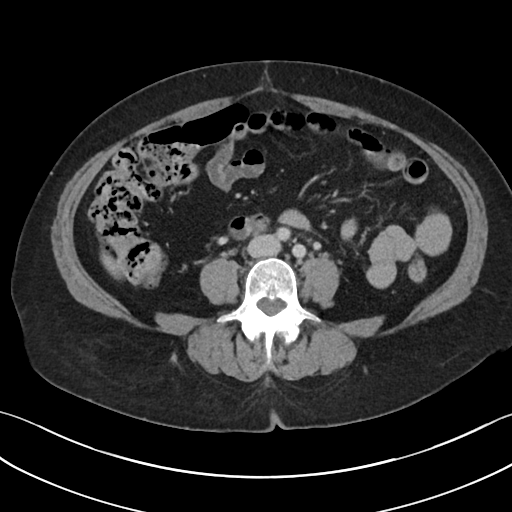
[im 48/83  soft-tissue]
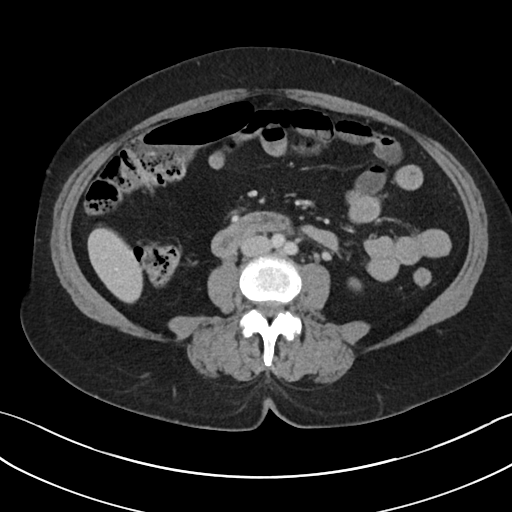
[im 48/83  bone]
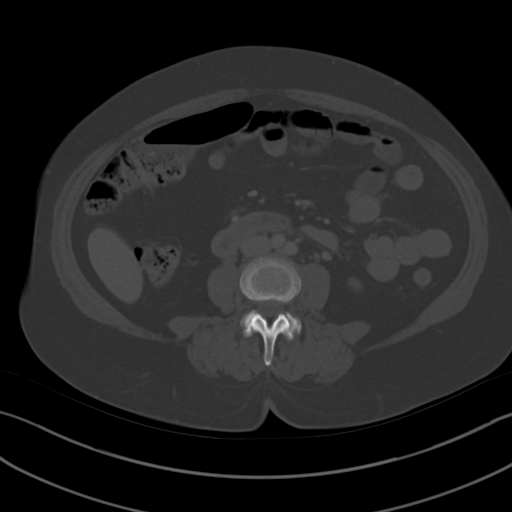
[im 57/83  soft-tissue]
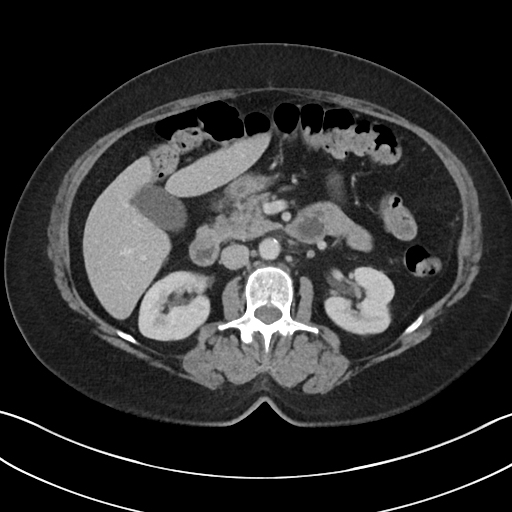
[im 61/83  soft-tissue]
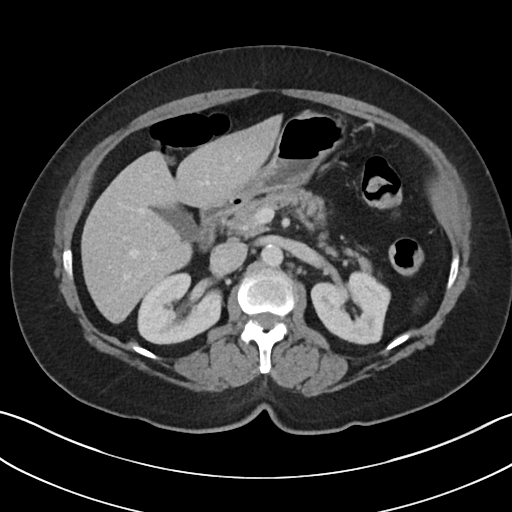
[im 65/83  soft-tissue]
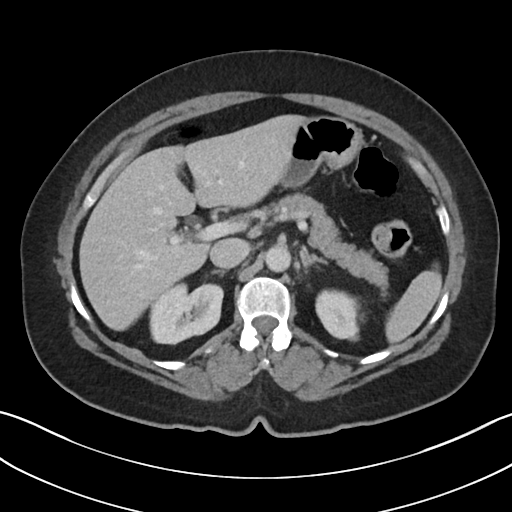
[im 74/83  soft-tissue]
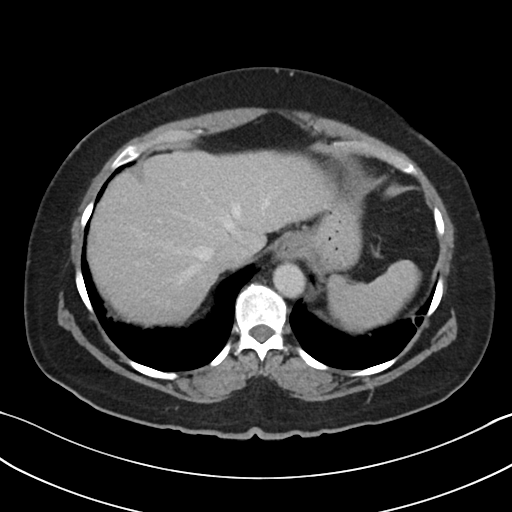
[im 78/83  soft-tissue]
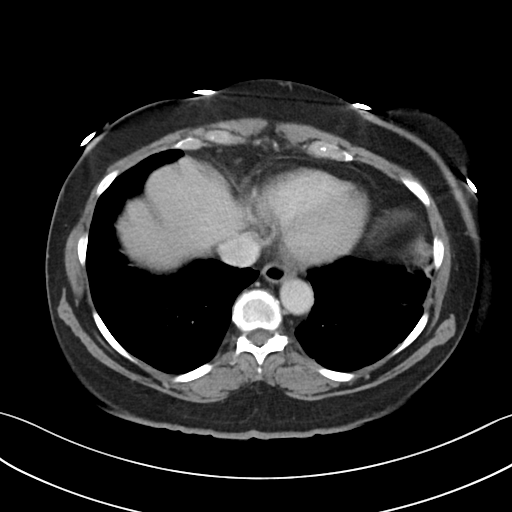

[Series 5: coronal · coronal · 0.81mm/px · 3 of 87 slices shown]
[im 29/87  soft-tissue]
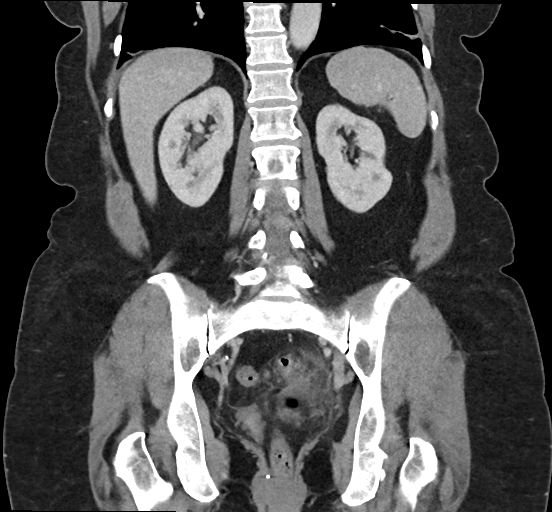
[im 39/87  soft-tissue]
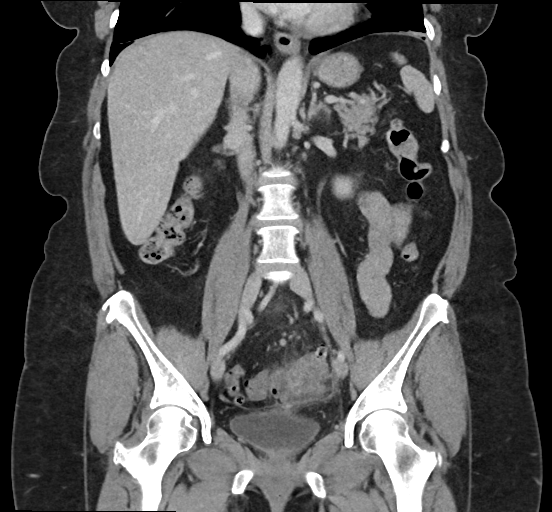
[im 48/87  soft-tissue]
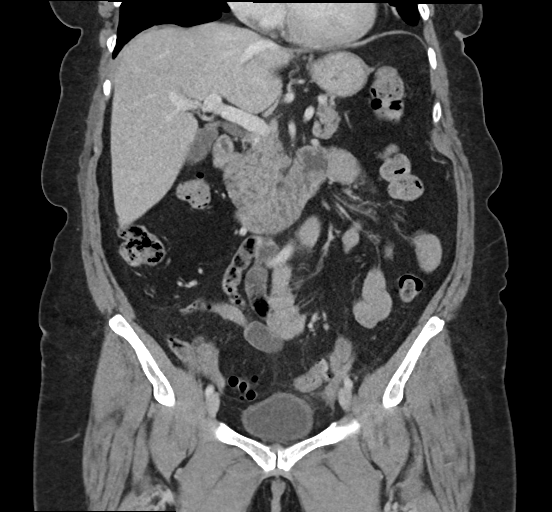

[17 of 46 positions shown; findings below may reference images not displayed]

FINDINGS: Lower chest: The visualized lung bases are clear.

No intra-abdominal free air or free fluid.

Hepatobiliary: No focal liver abnormality is seen. No gallstones,
gallbladder wall thickening, or biliary dilatation.

Pancreas: Unremarkable. No pancreatic ductal dilatation or
surrounding inflammatory changes.

Spleen: Normal in size without focal abnormality.

Adrenals/Urinary Tract: The adrenal glands unremarkable. The
kidneys, visualized ureters, and urinary bladder appear
unremarkable.

Stomach/Bowel: There is sigmoid diverticulosis with active
inflammatory changes consistent with acute diverticulitis. No
diverticular abscess or perforation. Moderate stool throughout the
colon. There is no bowel obstruction. The appendix is normal.

Vascular/Lymphatic: Mild aortoiliac atherosclerotic disease. The IVC
is unremarkable. No portal venous gas. There is no adenopathy.

Reproductive: Hysterectomy.  No adnexal masses.

Other: None

Musculoskeletal: Degenerative changes of the spine. No acute osseous
pathology.
IMPRESSION: 1. Sigmoid diverticulitis. No diverticular abscess or perforation.
2. Aortic Atherosclerosis (89J37-JMC.C).

## 2022-01-28 ENCOUNTER — Other Ambulatory Visit: Payer: Self-pay | Admitting: Internal Medicine

## 2022-04-04 ENCOUNTER — Ambulatory Visit: Payer: Medicare HMO | Admitting: Internal Medicine

## 2022-05-20 ENCOUNTER — Encounter (INDEPENDENT_AMBULATORY_CARE_PROVIDER_SITE_OTHER): Payer: Self-pay

## 2022-06-13 DIAGNOSIS — R7303 Prediabetes: Secondary | ICD-10-CM | POA: Diagnosis not present

## 2022-06-13 DIAGNOSIS — E7800 Pure hypercholesterolemia, unspecified: Secondary | ICD-10-CM | POA: Diagnosis not present

## 2022-06-13 DIAGNOSIS — M1990 Unspecified osteoarthritis, unspecified site: Secondary | ICD-10-CM | POA: Diagnosis not present

## 2022-06-13 DIAGNOSIS — E039 Hypothyroidism, unspecified: Secondary | ICD-10-CM | POA: Diagnosis not present

## 2022-06-13 DIAGNOSIS — G629 Polyneuropathy, unspecified: Secondary | ICD-10-CM | POA: Diagnosis not present

## 2022-06-13 DIAGNOSIS — M797 Fibromyalgia: Secondary | ICD-10-CM | POA: Diagnosis not present

## 2022-06-13 DIAGNOSIS — E559 Vitamin D deficiency, unspecified: Secondary | ICD-10-CM | POA: Diagnosis not present

## 2022-06-13 DIAGNOSIS — R69 Illness, unspecified: Secondary | ICD-10-CM | POA: Diagnosis not present

## 2022-06-17 DIAGNOSIS — R7303 Prediabetes: Secondary | ICD-10-CM | POA: Diagnosis not present

## 2022-06-17 DIAGNOSIS — Z6829 Body mass index (BMI) 29.0-29.9, adult: Secondary | ICD-10-CM | POA: Diagnosis not present

## 2022-06-17 DIAGNOSIS — Z79899 Other long term (current) drug therapy: Secondary | ICD-10-CM | POA: Diagnosis not present

## 2022-06-17 DIAGNOSIS — E559 Vitamin D deficiency, unspecified: Secondary | ICD-10-CM | POA: Diagnosis not present

## 2022-06-17 DIAGNOSIS — Z0000 Encounter for general adult medical examination without abnormal findings: Secondary | ICD-10-CM | POA: Diagnosis not present

## 2022-06-17 DIAGNOSIS — R69 Illness, unspecified: Secondary | ICD-10-CM | POA: Diagnosis not present

## 2022-06-17 DIAGNOSIS — I700 Atherosclerosis of aorta: Secondary | ICD-10-CM | POA: Diagnosis not present

## 2022-06-17 DIAGNOSIS — E7800 Pure hypercholesterolemia, unspecified: Secondary | ICD-10-CM | POA: Diagnosis not present

## 2022-06-17 DIAGNOSIS — J4540 Moderate persistent asthma, uncomplicated: Secondary | ICD-10-CM | POA: Diagnosis not present

## 2022-09-25 DIAGNOSIS — Z0100 Encounter for examination of eyes and vision without abnormal findings: Secondary | ICD-10-CM | POA: Diagnosis not present

## 2022-09-25 DIAGNOSIS — H524 Presbyopia: Secondary | ICD-10-CM | POA: Diagnosis not present

## 2022-12-12 ENCOUNTER — Ambulatory Visit: Payer: Medicare HMO | Admitting: Cardiology

## 2022-12-13 DIAGNOSIS — F418 Other specified anxiety disorders: Secondary | ICD-10-CM | POA: Diagnosis not present

## 2022-12-13 DIAGNOSIS — E782 Mixed hyperlipidemia: Secondary | ICD-10-CM | POA: Diagnosis not present

## 2022-12-13 DIAGNOSIS — Z6829 Body mass index (BMI) 29.0-29.9, adult: Secondary | ICD-10-CM | POA: Diagnosis not present

## 2022-12-24 DIAGNOSIS — L814 Other melanin hyperpigmentation: Secondary | ICD-10-CM | POA: Diagnosis not present

## 2022-12-24 DIAGNOSIS — D1801 Hemangioma of skin and subcutaneous tissue: Secondary | ICD-10-CM | POA: Diagnosis not present

## 2022-12-24 DIAGNOSIS — D225 Melanocytic nevi of trunk: Secondary | ICD-10-CM | POA: Diagnosis not present

## 2022-12-24 DIAGNOSIS — L570 Actinic keratosis: Secondary | ICD-10-CM | POA: Diagnosis not present

## 2022-12-24 DIAGNOSIS — L821 Other seborrheic keratosis: Secondary | ICD-10-CM | POA: Diagnosis not present

## 2022-12-25 ENCOUNTER — Encounter: Payer: Self-pay | Admitting: Cardiology

## 2022-12-25 ENCOUNTER — Ambulatory Visit: Payer: Medicare HMO | Attending: Cardiology | Admitting: Cardiology

## 2022-12-25 VITALS — BP 148/76 | HR 80 | Resp 16 | Ht 61.0 in | Wt 153.4 lb

## 2022-12-25 DIAGNOSIS — E7800 Pure hypercholesterolemia, unspecified: Secondary | ICD-10-CM

## 2022-12-25 DIAGNOSIS — I700 Atherosclerosis of aorta: Secondary | ICD-10-CM | POA: Diagnosis not present

## 2022-12-25 DIAGNOSIS — R0609 Other forms of dyspnea: Secondary | ICD-10-CM

## 2022-12-25 DIAGNOSIS — R931 Abnormal findings on diagnostic imaging of heart and coronary circulation: Secondary | ICD-10-CM

## 2022-12-25 MED ORDER — RED YEAST RICE 600 MG PO CAPS: 600.0000 mg | ORAL_CAPSULE | Freq: Every day | ORAL | Status: AC

## 2022-12-25 NOTE — Patient Instructions (Addendum)
Medication Instructions:  Your physician has recommended you make the following change in your medication:   Red Yeast Rice 600 mg daily over the counter   *If you need a refill on your cardiac medications before your next appointment, please call your pharmacy*  Lab Work: None ordered today. If you have labs (blood work) drawn today and your tests are completely normal, you will receive your results only by: MyChart Message (if you have MyChart) OR A paper copy in the mail If you have any lab test that is abnormal or we need to change your treatment, we will call you to review the results.  Testing/Procedures: None ordered today.  Follow-Up: At [facility], you and your health needs are our priority.  As part of our continuing mission to provide you with exceptional heart care, we have created designated Provider Care Teams.  These Care Teams include your primary Cardiologist (physician) and Advanced Practice Providers (APPs -  Physician Assistants and Nurse Practitioners) who all work together to provide you with the care you need, when you need it.  We recommend signing up for the patient portal called "MyChart".  Sign up information is provided on this After Visit Summary.  MyChart is used to connect with patients for Virtual Visits (Telemedicine).  Patients are able to view lab/test results, encounter notes, upcoming appointments, etc.  Non-urgent messages can be sent to your provider as well.   To learn more about what you can do with MyChart, go to [email or web address].    Your next appointment:   1 year(s)  The format for your next appointment:   In Person  Provider:   Tessa Lerner, DO {

## 2022-12-25 NOTE — Progress Notes (Signed)
Cardiology Office Note:  .   Date:  12/25/2022  ID:  Andrea Aguirre, DOB 07/02/1950, MRN [id number] PCP:  Moshe Cipro, FNP  Former Cardiology Providers: N/A [facility] Providers Cardiologist:  Tessa Lerner, DO , [facility] (established care 12/25/22) Electrophysiologist:  None  Click to update primary MD,subspecialty MD or APP then REFRESH:1}    Chief Complaint  Patient presents with   Follow-up    1 year follow-up for dyspnea on exertion    History of Present Illness: .   Andrea Aguirre is a 72 y.o. Caucasian female whose past medical history and cardiovascular risk factors includes: Hypercholesterolemia, inflammatory arthritis, anxiety, aortic atherosclerosis, fibromyalgia, postmenopausal female   Initially referred to the practice in August 2023 for dyspnea on exertion and palpitations.  Since then she has undergone appropriate cardiovascular workup as outlined below.  She presents today for a 1 year follow-up visit.  Patient states that her dyspnea on exertion has now been resolved and she has been diagnosed with asthma in the interim.  She denies any chest pain or heart failure symptoms.  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  She had labs with PCP in April 2024 which were reviewed independently via [facility] database.  Review of Systems: .   Review of Systems  Cardiovascular:  Negative for chest pain, claudication, dyspnea on exertion, irregular heartbeat, leg swelling, near-syncope, orthopnea, palpitations, paroxysmal nocturnal dyspnea and syncope.  Respiratory:  Negative for shortness of breath.   Hematologic/Lymphatic: Negative for bleeding problem.  Musculoskeletal:  Negative for muscle cramps and myalgias.  Neurological:  Negative for dizziness and light-headedness.    Studies Reviewed:   EKG: EKG Interpretation Date/Time:  Wednesday December 25 2022 11:55:21 EDT Ventricular Rate:  80 PR  Interval:  164 QRS Duration:  82 QT Interval:  370 QTC Calculation: 426 R Axis:   54  Text Interpretation: Normal sinus rhythm Normal ECG Confirmed by Tessa Lerner [phone]) on 12/25/2022 12:04:57 PM  Echocardiogram: 11/29/2021:  Normal LV systolic function with visual EF 60-65%. Left ventricle cavity is normal in size. Normal left ventricular wall thickness. Normal global wall motion. Normal diastolic filling pattern, normal LAP.  Trace tricuspid regurgitation. No evidence of pulmonary hypertension.  No prior study for comparison.   Stress Testing: Exercise nuclear stress test 11/05/21 Myocardial perfusion is normal. Low risk study. Overall LV systolic function is normal without regional wall motion abnormalities. Stress LV EF: 66%.  Normal ECG stress. The patient exercised for 4 minutes and 32 seconds of a Bruce protocol, achieving approximately 6.46 METs and reached 93% MPHR. The heart rate response was normal. The blood pressure response was normal. No previous exam available for comparison.  RADIOLOGY: NA  Risk Assessment/Calculations:   NA   Labs:       Latest Ref Rng & Units 07/15/2020    7:00 PM 12/28/2014    3:23 PM 05/13/2011    5:17 PM  CBC  WBC 4.0 - 10.5 K/uL 13.0  8.1  9.6   Hemoglobin 12.0 - 15.0 g/dL 19.1  47.8  29.5   Hematocrit 36.0 - 46.0 % 42.7  44.5  42.5   Platelets 150 - 400 K/uL 262  212  234        Latest Ref Rng & Units 07/15/2020    7:00 PM 05/13/2011    5:17 PM 08/07/2010    3:09 PM  BMP  Glucose 70 - 99  mg/dL 161  096  78   BUN 8 - 23 mg/dL 11  12  15    Creatinine 0.44 - 1.00 mg/dL 0.45  4.09  1.0   Sodium 135 - 145 mmol/L 137  143  141   Potassium 3.5 - 5.1 mmol/L 3.7  4.8  4.4   Chloride 98 - 111 mmol/L 102  103  106   CO2 22 - 32 mmol/L 25  31  29    Calcium 8.9 - 10.3 mg/dL 8.9  81.1  8.8       Latest Ref Rng & Units 07/15/2020    7:00 PM 05/13/2011    5:17 PM 08/07/2010    3:09 PM  CMP  Glucose 70 - 99 mg/dL 914  782  78   BUN 8 - 23  mg/dL 11  12  15    Creatinine 0.44 - 1.00 mg/dL 9.56  2.13  1.0   Sodium 135 - 145 mmol/L 137  143  141   Potassium 3.5 - 5.1 mmol/L 3.7  4.8  4.4   Chloride 98 - 111 mmol/L 102  103  106   CO2 22 - 32 mmol/L 25  31  29    Calcium 8.9 - 10.3 mg/dL 8.9  08.6  8.8   Total Protein 6.5 - 8.1 g/dL 7.1  7.2  6.3   Total Bilirubin 0.3 - 1.2 mg/dL 1.0  0.6  0.8   Alkaline Phos 38 - 126 U/L 88  92  71   AST 15 - 41 U/L 16  22  23    ALT 0 - 44 U/L 20  22  23      No results found for: "CHOL", "HDL", "LDLCALC", "LDLDIRECT", "TRIG", "CHOLHDL" No results for input(s): "LIPOA" in the last 8760 hours. No components found for: "NTPROBNP" No results for input(s): "PROBNP" in the last 8760 hours. No results for input(s): "TSH" in the last 8760 hours.  External Labs: Collected: 10/05/2021. Hemoglobin 14.4 g/dL, hematocrit 57.8% BUN 14, creatinine 0.86. eGFR 72. Sodium 140, potassium 4.8, chloride 104, bicarb 30. AST 24, ALT 29, alkaline phosphatase 99 Total cholesterol 242, triglycerides 82, LDL 165, non-HDL 179, HDL 63.  External Labs: Collected: April 2024 [facility] database. Total cholesterol 238, triglycerides 64, HDL 58, LDL calculated 170. BUN 11, creatinine 0.95. A1c 5.7  Physical Exam:    Today's Vitals   12/25/22 1158  BP: (!) 148/76  Pulse: 80  Resp: 16  SpO2: 95%  Weight: 153 lb 6.4 oz (69.6 kg)  Height: 5\' 1"  (1.549 m)   Body mass index is 28.98 kg/m. Wt Readings from Last 3 Encounters:  12/25/22 153 lb 6.4 oz (69.6 kg)  12/26/21 176 lb (79.8 kg)  12/11/21 178 lb 3.2 oz (80.8 kg)    Physical Exam  Constitutional: No distress.  hemodynamically stable  Neck: No JVD present.  Cardiovascular: Normal rate, regular rhythm, S1 normal, S2 normal, intact distal pulses and normal pulses. Exam reveals no gallop, no S3 and no S4.  No murmur heard. Pulmonary/Chest: Effort normal and breath sounds normal. No stridor. She has no wheezes. She has no rales.  Abdominal: Soft. Bowel sounds  are normal. She exhibits no distension. There is no abdominal tenderness.  Musculoskeletal:        General: No edema.     Cervical back: Neck supple.  Neurological: She is alert and oriented to person, place, and time. She has intact cranial nerves (2-12).  Skin: Skin is warm and moist.     Impression &  Recommendation(s):  Impression:   ICD-10-CM   1. Dyspnea on exertion  R06.09 EKG 12-Lead    2. Pure hypercholesterolemia  E78.00     3. Atherosclerosis of aorta (HCC)  I70.0     4. Agatston coronary artery calcium score less than 100  R93.1        Recommendation(s):  Dyspnea on exertion Referred to the practice for evaluation of dyspnea on exertion. Has undergone appropriate ischemic workup as outlined above. In the interim she has been diagnosed with asthma and since then her dyspnea has resolved. No additional workup warranted at this time.  Pure hypercholesterolemia July 2023 LDL 165 mg/dL. In the interim she was started on red yeast rice and noted that repeat lipids had improved (I do not have those lipids available for review). April 2024 LDL 170 mg/dL Very minimal CAC as per her last coronary calcium score. 10-year risk of ASCVD based on current parameters 15.8%. We did discuss lipid-lowering agents. Patient remains hesitant as she is concerned that it may lead to dementia, type 3 diabetes, etc. She is willing to go back on red yeast rice. Long-term management per PCP  Atherosclerosis of aorta (HCC) Agatston coronary artery calcium score less than 100 Noted on nongated CT study in the past. Betti Cruz on aspirin 81 mg p.o. daily. Wants to avoid lipid-lowering agents for now but is agreeable to be on red yeast rice which has helped improve her numbers in the past.  Office blood pressure well-controlled.  We discussed monitoring blood pressures at home.  Patient states that home blood pressure usually 120 mmHg.  Will hold off on pharmacological therapy at this  time.  Orders Placed:  Orders Placed This Encounter  Procedures   EKG 12-Lead   Final Medication List:    Meds ordered this encounter  Medications   Red Yeast Rice 600 MG CAPS    Sig: Take 1 capsule (600 mg total) by mouth daily.    Medications Discontinued During This Encounter  Medication Reason   aspirin EC 81 MG tablet    Cholecalciferol (VITAMIN D3) 50 MCG (2000 UT) TABS    famotidine (PEPCID) 20 MG tablet    pantoprazole (PROTONIX) 40 MG tablet      Current Outpatient Medications:    albuterol (VENTOLIN HFA) 108 (90 Base) MCG/ACT inhaler, Inhale 2 puffs into the lungs as needed., Disp: , Rfl:    Red Yeast Rice 600 MG CAPS, Take 1 capsule (600 mg total) by mouth daily., Disp: , Rfl:  No current facility-administered medications for this visit.  Facility-Administered Medications Ordered in Other Visits:    dextrose 5 % and 0.45 % NaCl with KCl 20 mEq/L infusion, , Intravenous, Continuous, Violeta Gelinas, MD   fentaNYL (SUBLIMAZE) injection 25 mcg, 25 mcg, Intravenous, Q1H PRN, Violeta Gelinas, MD   ondansetron (ZOFRAN-ODT) disintegrating tablet 4 mg, 4 mg, Oral, Q6H PRN **OR** ondansetron (ZOFRAN) 4 mg in sodium chloride 0.9 % 50 mL IVPB, 4 mg, Intravenous, Q6H PRN, Violeta Gelinas, MD  Consent:   N/A  Disposition:   Follow-up in 1 year or as needed.  Her questions and concerns were addressed to her satisfaction. She voices understanding of the recommendations provided during this encounter.    Signed, Tessa Lerner, DO, [facility]  [facility]  [address] [place], [place] [address] 12/25/2022 4:49 PM

## 2023-01-24 DIAGNOSIS — R051 Acute cough: Secondary | ICD-10-CM | POA: Diagnosis not present

## 2023-01-24 DIAGNOSIS — R509 Fever, unspecified: Secondary | ICD-10-CM | POA: Diagnosis not present

## 2023-01-24 DIAGNOSIS — J029 Acute pharyngitis, unspecified: Secondary | ICD-10-CM | POA: Diagnosis not present

## 2023-01-24 DIAGNOSIS — Z20818 Contact with and (suspected) exposure to other bacterial communicable diseases: Secondary | ICD-10-CM | POA: Diagnosis not present

## 2023-04-04 DIAGNOSIS — R002 Palpitations: Secondary | ICD-10-CM | POA: Diagnosis not present

## 2023-05-01 DIAGNOSIS — C44529 Squamous cell carcinoma of skin of other part of trunk: Secondary | ICD-10-CM | POA: Diagnosis not present

## 2023-05-14 DIAGNOSIS — L821 Other seborrheic keratosis: Secondary | ICD-10-CM | POA: Diagnosis not present

## 2023-05-14 DIAGNOSIS — Z85828 Personal history of other malignant neoplasm of skin: Secondary | ICD-10-CM | POA: Diagnosis not present

## 2023-05-14 DIAGNOSIS — L570 Actinic keratosis: Secondary | ICD-10-CM | POA: Diagnosis not present

## 2023-05-19 DIAGNOSIS — R002 Palpitations: Secondary | ICD-10-CM | POA: Diagnosis not present

## 2023-05-19 DIAGNOSIS — E782 Mixed hyperlipidemia: Secondary | ICD-10-CM | POA: Diagnosis not present

## 2023-05-19 DIAGNOSIS — R7303 Prediabetes: Secondary | ICD-10-CM | POA: Diagnosis not present

## 2023-05-19 DIAGNOSIS — I471 Supraventricular tachycardia, unspecified: Secondary | ICD-10-CM | POA: Diagnosis not present

## 2023-05-19 DIAGNOSIS — E559 Vitamin D deficiency, unspecified: Secondary | ICD-10-CM | POA: Diagnosis not present

## 2023-06-13 DIAGNOSIS — I471 Supraventricular tachycardia, unspecified: Secondary | ICD-10-CM | POA: Diagnosis not present

## 2023-06-19 DIAGNOSIS — M8580 Other specified disorders of bone density and structure, unspecified site: Secondary | ICD-10-CM | POA: Diagnosis not present

## 2023-06-19 DIAGNOSIS — Z0000 Encounter for general adult medical examination without abnormal findings: Secondary | ICD-10-CM | POA: Diagnosis not present

## 2023-07-03 DIAGNOSIS — L400 Psoriasis vulgaris: Secondary | ICD-10-CM | POA: Diagnosis not present

## 2023-07-03 DIAGNOSIS — L821 Other seborrheic keratosis: Secondary | ICD-10-CM | POA: Diagnosis not present

## 2023-07-03 DIAGNOSIS — Z85828 Personal history of other malignant neoplasm of skin: Secondary | ICD-10-CM | POA: Diagnosis not present

## 2023-08-06 DIAGNOSIS — M25511 Pain in right shoulder: Secondary | ICD-10-CM | POA: Diagnosis not present

## 2023-08-13 DIAGNOSIS — S46011D Strain of muscle(s) and tendon(s) of the rotator cuff of right shoulder, subsequent encounter: Secondary | ICD-10-CM | POA: Diagnosis not present

## 2023-08-18 DIAGNOSIS — S46011D Strain of muscle(s) and tendon(s) of the rotator cuff of right shoulder, subsequent encounter: Secondary | ICD-10-CM | POA: Diagnosis not present

## 2023-08-26 DIAGNOSIS — S46011D Strain of muscle(s) and tendon(s) of the rotator cuff of right shoulder, subsequent encounter: Secondary | ICD-10-CM | POA: Diagnosis not present

## 2023-08-28 DIAGNOSIS — S46011D Strain of muscle(s) and tendon(s) of the rotator cuff of right shoulder, subsequent encounter: Secondary | ICD-10-CM | POA: Diagnosis not present

## 2023-09-01 DIAGNOSIS — S46011D Strain of muscle(s) and tendon(s) of the rotator cuff of right shoulder, subsequent encounter: Secondary | ICD-10-CM | POA: Diagnosis not present

## 2023-09-16 DIAGNOSIS — S46011D Strain of muscle(s) and tendon(s) of the rotator cuff of right shoulder, subsequent encounter: Secondary | ICD-10-CM | POA: Diagnosis not present

## 2023-10-10 DIAGNOSIS — Z03818 Encounter for observation for suspected exposure to other biological agents ruled out: Secondary | ICD-10-CM | POA: Diagnosis not present

## 2023-10-10 DIAGNOSIS — J029 Acute pharyngitis, unspecified: Secondary | ICD-10-CM | POA: Diagnosis not present

## 2023-10-10 DIAGNOSIS — R051 Acute cough: Secondary | ICD-10-CM | POA: Diagnosis not present

## 2023-10-10 DIAGNOSIS — R509 Fever, unspecified: Secondary | ICD-10-CM | POA: Diagnosis not present

## 2023-10-21 DIAGNOSIS — J22 Unspecified acute lower respiratory infection: Secondary | ICD-10-CM | POA: Diagnosis not present

## 2023-12-12 DIAGNOSIS — H5203 Hypermetropia, bilateral: Secondary | ICD-10-CM | POA: Diagnosis not present

## 2024-01-01 DIAGNOSIS — L814 Other melanin hyperpigmentation: Secondary | ICD-10-CM | POA: Diagnosis not present

## 2024-01-01 DIAGNOSIS — Z85828 Personal history of other malignant neoplasm of skin: Secondary | ICD-10-CM | POA: Diagnosis not present

## 2024-01-01 DIAGNOSIS — D1801 Hemangioma of skin and subcutaneous tissue: Secondary | ICD-10-CM | POA: Diagnosis not present

## 2024-01-01 DIAGNOSIS — D225 Melanocytic nevi of trunk: Secondary | ICD-10-CM | POA: Diagnosis not present

## 2024-01-01 DIAGNOSIS — L821 Other seborrheic keratosis: Secondary | ICD-10-CM | POA: Diagnosis not present

## 2024-01-19 ENCOUNTER — Encounter (HOSPITAL_BASED_OUTPATIENT_CLINIC_OR_DEPARTMENT_OTHER): Payer: Self-pay | Admitting: Emergency Medicine

## 2024-01-19 ENCOUNTER — Other Ambulatory Visit: Payer: Self-pay

## 2024-01-19 ENCOUNTER — Emergency Department (HOSPITAL_BASED_OUTPATIENT_CLINIC_OR_DEPARTMENT_OTHER)
Admission: EM | Admit: 2024-01-19 | Discharge: 2024-01-19 | Disposition: A | Discharge status: Home / Self Care | Source: Ambulatory Visit | Attending: Emergency Medicine | Admitting: Emergency Medicine

## 2024-01-19 ENCOUNTER — Emergency Department (HOSPITAL_BASED_OUTPATIENT_CLINIC_OR_DEPARTMENT_OTHER): Admitting: Radiology

## 2024-01-19 DIAGNOSIS — R9431 Abnormal electrocardiogram [ECG] [EKG]: Secondary | ICD-10-CM | POA: Diagnosis not present

## 2024-01-19 DIAGNOSIS — R030 Elevated blood-pressure reading, without diagnosis of hypertension: Secondary | ICD-10-CM | POA: Insufficient documentation

## 2024-01-19 DIAGNOSIS — I10 Essential (primary) hypertension: Secondary | ICD-10-CM | POA: Diagnosis not present

## 2024-01-19 DIAGNOSIS — R5383 Other fatigue: Secondary | ICD-10-CM | POA: Diagnosis not present

## 2024-01-19 LAB — CBC
HCT: 43.8 % (ref 36.0–46.0)
Hemoglobin: 15 g/dL (ref 12.0–15.0)
MCH: 32.4 pg (ref 26.0–34.0)
MCHC: 34.2 g/dL (ref 30.0–36.0)
MCV: 94.6 fL (ref 80.0–100.0)
Platelets: 232 10*3/uL (ref 150–400)
RBC: 4.63 MIL/uL (ref 3.87–5.11)
RDW: 12.8 % (ref 11.5–15.5)
WBC: 7.3 10*3/uL (ref 4.0–10.5)
nRBC: 0 % (ref 0.0–0.2)

## 2024-01-19 LAB — TROPONIN T, HIGH SENSITIVITY: Troponin T High Sensitivity: <15 ng/L (ref 0–19)

## 2024-01-19 LAB — BASIC METABOLIC PANEL WITH GFR
Anion gap: 10 (ref 5–15)
BUN: 11 mg/dL (ref 8–23)
CO2: 27 mmol/L (ref 22–32)
Calcium: 9.9 mg/dL (ref 8.9–10.3)
Chloride: 102 mmol/L (ref 98–111)
Creatinine, Ser: 0.75 mg/dL (ref 0.44–1.00)
GFR, Estimated: >60 mL/min
Glucose, Bld: 91 mg/dL (ref 70–99)
Potassium: 4.1 mmol/L (ref 3.5–5.1)
Sodium: 139 mmol/L (ref 135–145)

## 2024-01-19 MED ORDER — AMLODIPINE BESYLATE 5 MG PO TABS: 5.0000 mg | ORAL_TABLET | Freq: Every day | ORAL | 1 refills | Status: AC

## 2024-01-19 NOTE — ED Notes (Signed)
 Reviewed AVS/discharge instruction with patient. Time allotted for and all questions answered. Patient is agreeable for d/c and escorted to ed exit by staff.

## 2024-01-19 NOTE — Discharge Instructions (Signed)
 I would recommend continuing to check your blood pressure once daily and record it for when you follow-up with primary care.  I have started you on low-dose blood pressure medicine please take once daily.  If you have side effects like leg swelling, dizziness or lightheadedness you may discontinue.

## 2024-01-19 NOTE — ED Triage Notes (Signed)
 High BP last night 180/ 99 Seen at [facility] walk in told EKG was different Denies sob or chest pain Going out of town tomorrow

## 2024-01-19 NOTE — ED Provider Notes (Signed)
  EMERGENCY DEPARTMENT AT [facility] Provider Note   CSN: [id number] Arrival date & time: 01/19/24  1203     Patient presents with: Hypertension   Andrea Aguirre is a 73 y.o. female patient with past medical history of hyperlipidemia, depression, fibromyalgia presents to emergency room after having a primary care visit and being told she had hypertension.  Her systolic was reportedly 140-160 at home.  She denies any symptoms and she denies any shortness of breath, chest pain. Does not currently take HTN Rx. No exertional SOB, CP, weakness, diaphoresis. No leg swelling.      Hypertension       Prior to Admission medications   Medication Sig Start Date End Date Taking? Authorizing Provider  amLODipine (NORVASC) 5 MG tablet Take 1 tablet (5 mg total) by mouth daily. 01/19/24  Yes Makeshia Seat, Warren SAILOR, PA-C  albuterol  (VENTOLIN  HFA) 108 (90 Base) MCG/ACT inhaler Inhale 2 puffs into the lungs as needed. 10/30/21   [provider]  Red Yeast Rice 600 MG CAPS Take 1 capsule (600 mg total) by mouth daily. 12/25/22   Tolia, Sunit, DO    Allergies: Codeine and Valacyclovir hcl    Review of Systems  Constitutional:  Positive for activity change.    Updated Vital Signs BP (!) 153/63 (BP Location: Left Arm)   Pulse 73   Temp 98 F (36.7 C)   Resp 18   SpO2 98%   Physical Exam Vitals and nursing note reviewed.  Constitutional:      General: She is not in acute distress.    Appearance: She is not toxic-appearing.  HENT:     Head: Normocephalic and atraumatic.  Eyes:     General: No scleral icterus.    Conjunctiva/sclera: Conjunctivae normal.  Cardiovascular:     Rate and Rhythm: Normal rate and regular rhythm.     Pulses: Normal pulses.     Heart sounds: Normal heart sounds.  Pulmonary:     Effort: Pulmonary effort is normal. No respiratory distress.     Breath sounds: Normal breath sounds.  Abdominal:     General: Abdomen is flat. Bowel sounds  are normal.     Palpations: Abdomen is soft.     Tenderness: There is no abdominal tenderness.  Musculoskeletal:     Right lower leg: No edema.     Left lower leg: No edema.  Skin:    General: Skin is warm and dry.     Findings: No lesion.  Neurological:     General: No focal deficit present.     Mental Status: She is alert and oriented to person, place, and time. Mental status is at baseline.     (all labs ordered are listed, but only abnormal results are displayed) Labs Reviewed  BASIC METABOLIC PANEL WITH GFR  CBC  TROPONIN T, HIGH SENSITIVITY    EKG: None  Radiology: DG Chest 2 View Result Date: 01/19/2024 CLINICAL DATA:  Abnormal EKG EXAM: CHEST - 2 VIEW COMPARISON:  Chest x-ray performed September 24, 2019 FINDINGS: Improved aeration of the lung bases. Bibasilar scarring. No pleural effusion or pneumothorax. IMPRESSION: No active cardiopulmonary disease. Electronically Signed   By: Maude Naegeli M.D.   On: 01/19/2024 13:01     Procedures   Medications Ordered in the ED - No data to display  Medical Decision Making Amount and/or Complexity of Data Reviewed Labs: ordered. Radiology: ordered.  Risk Prescription drug management.   This patient presents to the ED for concern of hypertension, this involves an extensive number of treatment options, and is a complaint that carries with it a high risk of complications and morbidity.  The differential diagnosis includes ACS, stable angina, CHF, pneumonia, pneumothorax, aortic dissection, pulmonary embolus    Co morbidities that complicate the patient evaluation  HLD   Additional history obtained:  Additional history obtained from recent PCP visit, no hx of HTN, no HTN Rx   Lab Tests:  I personally interpreted labs.  The pertinent results include:   CBC, BMP unremarkable Troponin <15    Imaging Studies ordered:  I ordered imaging studies including chest x-ray I independently  visualized and interpreted imaging which showed no acute findings I agree with the radiologist interpretation   Cardiac Monitoring: / EKG:  The patient was maintained on a cardiac monitor.  I personally viewed and interpreted the cardiac monitored which showed an underlying rhythm of: sinus without ST changes    Problem List / ED Course / Critical interventions / Medication management  Presents to emergency room with complaint of hypertension.  Patient reports that she has a home blood pressure cuff and she check it yesterday and found to be elevated. She tells me she is not having any symptoms with this, including no CP, SOB, swelling, diaphoresis, HA.  She has no focal neurological deficits.  Lungs are clear to auscultation.  Vitals are otherwise stable.  Her workup here shows no sign of hypertensive emergency.  Her blood pressure was initially 190 when she checked after no intervention BP 145 systolic.  Given overall reassuring workup and improved vital signs feel she stable for discharge with close outpatient follow-up. I have reviewed the patients home medicines and have made adjustments as needed. Will start her on amlodipine to take once daily, discussed tensional side effects with starting this medicine, will follow-up with cardiology in the meantime.  Given return precautions.      Final diagnoses:  Elevated blood pressure reading    ED Discharge Orders          Ordered    amLODipine (NORVASC) 5 MG tablet  Daily        01/19/24 1358    Ambulatory referral to Cardiology       Comments: If you have not heard from the Cardiology office within the next 72 hours please call [phone].   01/19/24 1358               Fritzi Scripter, Warren SAILOR, PA-C 01/19/24 1417    Long, Fonda MATSU, MD 01/19/24 1718

## 2024-01-28 DIAGNOSIS — R1013 Epigastric pain: Secondary | ICD-10-CM | POA: Diagnosis not present

## 2024-01-29 DIAGNOSIS — R1013 Epigastric pain: Secondary | ICD-10-CM | POA: Diagnosis not present

## 2024-02-18 ENCOUNTER — Encounter: Payer: Self-pay | Admitting: Cardiology

## 2024-02-18 ENCOUNTER — Ambulatory Visit: Attending: Cardiology | Admitting: Cardiology

## 2024-02-18 VITALS — BP 138/82 | HR 92 | Resp 16 | Ht 61.0 in | Wt 163.4 lb

## 2024-02-18 DIAGNOSIS — I700 Atherosclerosis of aorta: Secondary | ICD-10-CM

## 2024-02-18 DIAGNOSIS — E7800 Pure hypercholesterolemia, unspecified: Secondary | ICD-10-CM

## 2024-02-18 DIAGNOSIS — R931 Abnormal findings on diagnostic imaging of heart and coronary circulation: Secondary | ICD-10-CM | POA: Diagnosis not present

## 2024-02-18 DIAGNOSIS — R0609 Other forms of dyspnea: Secondary | ICD-10-CM | POA: Diagnosis not present

## 2024-02-18 MED ORDER — EZETIMIBE 10 MG PO TABS: 10.0000 mg | ORAL_TABLET | Freq: Every day | ORAL | 3 refills | Status: AC

## 2024-02-18 NOTE — Patient Instructions (Signed)
 Medication Instructions:   START zetia 10mg  daily   *If you need a refill on your cardiac medications before your next appointment, please call your pharmacy*  Lab Work:  FASTING lab work in 6 weeks -- approx: Mar 31, 2024  If you have labs (blood work) drawn today and your tests are completely normal, you will receive your results only by: MyChart Message (if you have MyChart) OR A paper copy in the mail If you have any lab test that is abnormal or we need to change your treatment, we will call you to review the results.   Follow-Up: At [facility], you and your health needs are our priority.  As part of our continuing mission to provide you with exceptional heart care, our providers are all part of one team.  This team includes your primary Cardiologist (physician) and Advanced Practice Providers or APPs (Physician Assistants and Nurse Practitioners) who all work together to provide you with the care you need, when you need it.  Your next appointment:    12 months with Dr. Michele  We recommend signing up for the patient portal called MyChart.  Sign up information is provided on this After Visit Summary.  MyChart is used to connect with patients for Virtual Visits (Telemedicine).  Patients are able to view lab/test results, encounter notes, upcoming appointments, etc.  Non-urgent messages can be sent to your provider as well.   To learn more about what you can do with MyChart, go to [email or web address].

## 2024-02-18 NOTE — Progress Notes (Signed)
 Cardiology Office Note:  .   ID:  Andrea Aguirre, DOB [birth year], MRN [id number] PCP:  Dayna Motto, DO  Former Cardiology Providers: N/A [facility] Providers Cardiologist:  Madonna Large, DO , [facility] (established care 12/25/22) Electrophysiologist:  None  Click to update primary MD,subspecialty MD or APP then REFRESH:1}    Chief Complaint  Patient presents with   Dyspnea on exertion   Follow-up    History of Present Illness: .   Andrea Aguirre is a 73 y.o. Caucasian female whose past medical history and cardiovascular risk factors includes: Hypercholesterolemia, inflammatory arthritis, anxiety, aortic atherosclerosis, fibromyalgia, postmenopausal female   Initially referred to the practice in August 2023 for dyspnea on exertion and palpitations.  Since then she has undergone appropriate cardiovascular workup as outlined below.  In the past we have discussed management for hyperlipidemia.  Her LDL levels have been as high as 170 mg/dL back in 7975.  She was noted to have minimal CAC, and 10-year risk of ASCVD in the past with as high as 15.8%.  Patient was reluctant to be on lipid-lowering agents due to concerns that it may lead to dementia and/or diabetes.  She was going to restart red yeast rice and have her lipids followed by PCP.  At the last office visit patient was recommended to follow-up annually or as needed.  She presents today for a 1 year follow-up visit.  Patient remains stable from a cardiovascular standpoint over the last 1 year. She had an episode of palpitations at night during the interim and had a cardiac monitor with her PCP.  I do not have those results available for review.  But the results did not impact her medical management.  She has a long history of hyperlipidemia with LDL levels as high as 170 mg/dL which have improved with lifestyle modifications, exercise, and emergency clinics.  But still not at goal.  Patient remains reluctant to be on  lipid-lowering agents due to side effect profile ( notable for new onset of diabetes, memory issues, etc.).  Occasionally she has noted her blood pressures being elevated.  She went to walk-in clinic at [facility] was asked to go to the ER.  In the ER patient stated that she was told of having abnormal EKG.  Otherwise her home blood pressure ranges between 120-140 mmHg on current regimen.  Review of Systems: .   Review of Systems  Cardiovascular:  Negative for chest pain, claudication, dyspnea on exertion, irregular heartbeat, leg swelling, near-syncope, orthopnea, palpitations, paroxysmal nocturnal dyspnea and syncope.  Respiratory:  Negative for shortness of breath.   Hematologic/Lymphatic: Negative for bleeding problem.  Musculoskeletal:  Negative for muscle cramps and myalgias.  Neurological:  Negative for dizziness and light-headedness.    Studies Reviewed:   EKG: EKG Interpretation Date/Time:  Wednesday February 18 2024 08:19:09 EST Ventricular Rate:  95 PR Interval:  178 QRS Duration:  68 QT Interval:  340 QTC Calculation: 427 R Axis:   108  Text Interpretation: Normal sinus rhythm Rightward axis Cannot rule out Anterior infarct (cited on or before 19-Jan-2024) When compared with ECG of 19-Jan-2024 12:15, No significant change was found Confirmed by Large Madonna [id number]) on 02/18/2024 8:22:30 AM  Echocardiogram: 11/29/2021:  Normal LV systolic function with visual EF 60-65%. Left ventricle cavity is normal in size. Normal left ventricular wall thickness. Normal global wall motion. Normal diastolic filling pattern, normal LAP.  Trace tricuspid regurgitation. No evidence of pulmonary hypertension.  No prior study for comparison.  Stress Testing: Exercise nuclear stress test 11/05/21 Myocardial perfusion is normal. Low risk study.  CT Cardiac Scoring:  10/22/2021  Total CAC 1 AU, 37th percentile for patient's age, sex, and race.  Noncardiac findings: Bibasilar scarring within the  visual lung fields.   RADIOLOGY: NA  Risk Assessment/Calculations:   NA   Labs:       Latest Ref Rng & Units 01/19/2024   12:18 PM 07/15/2020    7:00 PM 12/28/2014    3:23 PM  CBC  WBC 4.0 - 10.5 K/uL 7.3  13.0  8.1   Hemoglobin 12.0 - 15.0 g/dL 84.9  85.7  85.0   Hematocrit 36.0 - 46.0 % 43.8  42.7  44.5   Platelets 150 - 400 K/uL 232  262  212        Latest Ref Rng & Units 01/19/2024   12:18 PM 07/15/2020    7:00 PM 05/13/2011    5:17 PM  BMP  Glucose 70 - 99 mg/dL 91  883  879   BUN 8 - 23 mg/dL 11  11  12    Creatinine 0.44 - 1.00 mg/dL 9.24  9.18  9.20   Sodium 135 - 145 mmol/L 139  137  143   Potassium 3.5 - 5.1 mmol/L 4.1  3.7  4.8   Chloride 98 - 111 mmol/L 102  102  103   CO2 22 - 32 mmol/L 27  25  31    Calcium 8.9 - 10.3 mg/dL 9.9  8.9  89.9       Latest Ref Rng & Units 01/19/2024   12:18 PM 07/15/2020    7:00 PM 05/13/2011    5:17 PM  CMP  Glucose 70 - 99 mg/dL 91  883  879   BUN 8 - 23 mg/dL 11  11  12    Creatinine 0.44 - 1.00 mg/dL 9.24  9.18  9.20   Sodium 135 - 145 mmol/L 139  137  143   Potassium 3.5 - 5.1 mmol/L 4.1  3.7  4.8   Chloride 98 - 111 mmol/L 102  102  103   CO2 22 - 32 mmol/L 27  25  31    Calcium 8.9 - 10.3 mg/dL 9.9  8.9  89.9   Total Protein 6.5 - 8.1 g/dL  7.1  7.2   Total Bilirubin 0.3 - 1.2 mg/dL  1.0  0.6   Alkaline Phos 38 - 126 U/L  88  92   AST 15 - 41 U/L  16  22   ALT 0 - 44 U/L  20  22     No results found for: CHOL, HDL, LDLCALC, LDLDIRECT, TRIG, CHOLHDL No results for input(s): LIPOA in the last 8760 hours. No components found for: NTPROBNP No results for input(s): PROBNP in the last 8760 hours. No results for input(s): TSH in the last 8760 hours.  External Labs: Collected: 10/05/2021. Hemoglobin 14.4 g/dL, hematocrit 57.0% BUN 14, creatinine 0.86. eGFR 72. Sodium 140, potassium 4.8, chloride 104, bicarb 30. AST 24, ALT 29, alkaline phosphatase 99 Total cholesterol 242, triglycerides 82, LDL 165,  non-HDL 179, HDL 63.  External Labs: Collected: April 2024 [facility] database. Total cholesterol 238, triglycerides 64, HDL 58, LDL calculated 170. BUN 11, creatinine 0.95. A1c 5.7  Basic Metabolic Reviewed date:06/13/2023 12:54:15 PM Interpretation: Performing Lab: Notes/Report: Testing Performed at: [facility], [address], [address], [place], [place] [address]  Glucose 91 70-99 mg/dL    BUN 14 3-73 mg/dL    Creatinine  0.83 0.60-1.30 mg/dl    [id number] 74 >39 calc In accordance with recommendations from NKF-ASN Task Force, [facility] has updated its eGFR calc to the 2021 CKD-EDI equation that estimates kidney function without a race variable;Stage 1 > 90 ML/Min plus Albuminuria;Stage 2 60-89 ML/MIN;Stage 3 30-59 ML/MIN;Stage 4 15-29 ML/MIN;Stage 5 <15 ML/MIN  Sodium 143 136-145 mmol/L    Potassium 4.9 3.5-5.5 mmol/L    Chloride 107 98-107 mmol/L    CO2 32 22-32 mmol/L    Anion Gap 9.5 6.0-20.0 mmol/L    Calcium 9.4 8.6-10.3 mg/dL    Albumin 4.0 6.5-5.1 g/dL    CA-corrected 0.55 1.39-89.69 mg/dL    Lipid Panel w/reflex Reviewed date:06/13/2023 12:57:54 PM Interpretation: Performing Lab: Notes/Report: Testing Performed at: [facility], [address], [place], [place] [address]  Cholesterol 232 <200 mg/dL    CHOL/HDL 3.5 7.9-5.9 Ratio    HDLD 66 30-85 mg/dL Values below 40 mg/dL indicate increased risk factor  Triglyceride 98 0-199 mg/dL    NHDL 833 9-870 mg/dL Range dependent upon risk factors.  LDL Chol Calc (NIH) 149 0-99 mg/dL    Magnesium, Serum ([id number]) Reviewed date:06/14/2023 02:09:31 PM Interpretation: Performing Lab: Notes/Report: Testing performed at: [BN] [facility], [address], [place], [place], [address], Phone: [phone], Laboratory Director: Frankey Sas, MD  Magnesium 2.1 1.6-2.3 mg/dL      Physical Exam:    Today's Vitals   02/18/24 0816  BP: 138/82  Pulse: 92  Resp: 16  SpO2: 95%  Weight: 163 lb 6.4 oz (74.1 kg)  Height:  5' 1 (1.549 m)    Body mass index is 30.87 kg/m. Wt Readings from Last 3 Encounters:  02/18/24 163 lb 6.4 oz (74.1 kg)  12/25/22 153 lb 6.4 oz (69.6 kg)  12/26/21 176 lb (79.8 kg)    Physical Exam  Constitutional: No distress.  hemodynamically stable  Neck: No JVD present.  Cardiovascular: Normal rate, regular rhythm, S1 normal, S2 normal, intact distal pulses and normal pulses. Exam reveals no gallop, no S3 and no S4.  No murmur heard. Pulmonary/Chest: Effort normal and breath sounds normal. No stridor. She has no wheezes. She has no rales.  Abdominal: Soft. Bowel sounds are normal. She exhibits no distension. There is no abdominal tenderness.  Musculoskeletal:        General: No edema.     Cervical back: Neck supple.  Neurological: She is alert and oriented to person, place, and time. She has intact cranial nerves (2-12).  Skin: Skin is warm and moist.     Impression & Recommendation(s):  Impression:   ICD-10-CM   1. Dyspnea on exertion  R06.09 EKG 12-Lead    2. Pure hypercholesterolemia  E78.00 Comprehensive metabolic panel with GFR    Lipid panel    ezetimibe  (ZETIA ) 10 MG tablet    3. Atherosclerosis of aorta  I70.0     4. Agatston coronary artery calcium score less than 100  R93.1        Recommendation(s):  Dyspnea on exertion Has undergone echo and stress test in the past. EKG today is nonischemic She also continued her workup for shortness of breath outside cardiology and was diagnosed with asthma and since then symptoms have significantly improved.  Pure hypercholesterolemia July 2023 LDL 165 mg/dL. April 2024 LDL 170 mg/dL April 2025 LDL 850 mg/dL Very minimal CAC as per her last coronary calcium score. 10-year risk of ASCVD based on current parameters 20%-appropriate for moderate to high intensity statin therapy We did discuss lipid-lowering  agents (statin and nonstatin) but patient remained hesitant due to concerns that it may lead to dementia,  diabetes, etc. Patient understands her cholesterol panel as well as her 10-year risk of ASCVD.SABRA  Does not want to be on statins. Shared decision was to consider nonstatin options such as PCSK9 inhibitors or Zetia /bempedoic acid  Patient agreeable to start Zetia  10 mg p.o. daily. Will order fasting lipid and LDL in 6 weeks to reevaluate therapy She will continue to have this discussion with her PCP as well  Atherosclerosis of aorta (HCC) Agatston coronary artery calcium score less than 100 Very minimal CAC, total score of 1 placing her at the 37th percentile back in 2023  10-year risk of ASCVD has remained greater than 7.5% upon multiple office visits  Lipid management as mentioned above Clinically she is asymptomatic and therefore no additional workup warranted at this time  Office blood pressures are acceptable but not at goal. Has had elevated readings in the recent past prompting ED evaluation Recommend that she start checking her blood pressures regularly and to work with PCP with further medication titration as warranted.  Orders Placed:  Orders Placed This Encounter  Procedures   Comprehensive metabolic panel with GFR   Lipid panel   EKG 12-Lead   Final Medication List:    Meds ordered this encounter  Medications   ezetimibe  (ZETIA ) 10 MG tablet    Sig: Take 1 tablet (10 mg total) by mouth daily.    Dispense:  90 tablet    Refill:  3    There are no discontinued medications.    Current Outpatient Medications:    albuterol  (VENTOLIN  HFA) 108 (90 Base) MCG/ACT inhaler, Inhale 2 puffs into the lungs as needed., Disp: , Rfl:    amLODipine  (NORVASC ) 5 MG tablet, Take 1 tablet (5 mg total) by mouth daily., Disp: 30 tablet, Rfl: 1   ezetimibe  (ZETIA ) 10 MG tablet, Take 1 tablet (10 mg total) by mouth daily., Disp: 90 tablet, Rfl: 3   Red Yeast Rice 600 MG CAPS, Take 1 capsule (600 mg total) by mouth daily., Disp: , Rfl:  No current facility-administered medications for this  visit.  Facility-Administered Medications Ordered in Other Visits:    dextrose  5 % and 0.45 % NaCl with KCl 20 mEq/L infusion, , Intravenous, Continuous, Sebastian Moles, MD   fentaNYL  (SUBLIMAZE ) injection 25 mcg, 25 mcg, Intravenous, Q1H PRN, Sebastian Moles, MD   ondansetron  (ZOFRAN -ODT) disintegrating tablet 4 mg, 4 mg, Oral, Q6H PRN **OR** ondansetron  (ZOFRAN ) 4 mg in sodium chloride  0.9 % 50 mL IVPB, 4 mg, Intravenous, Q6H PRN, Sebastian Moles, MD  Consent:   N/A  Disposition:   1 year follow-up sooner if needed  Her questions and concerns were addressed to her satisfaction. She voices understanding of the recommendations provided during this encounter.    Signed, Madonna Michele HAS, [facility]  A Division of  [facility] [address], [place], [place] [address]

## 2024-02-21 ENCOUNTER — Encounter: Payer: Self-pay | Admitting: Cardiology
